# Patient Record
Sex: Female | Born: 1960 | Hispanic: No
Health system: Southern US, Community
[De-identification: ages and names within clinical notes are randomized; demographics above are authoritative.]

## PROBLEM LIST (undated history)

## (undated) DIAGNOSIS — I2510 Atherosclerotic heart disease of native coronary artery without angina pectoris: Secondary | ICD-10-CM

## (undated) DIAGNOSIS — G4733 Obstructive sleep apnea (adult) (pediatric): Secondary | ICD-10-CM

## (undated) DIAGNOSIS — J189 Pneumonia, unspecified organism: Secondary | ICD-10-CM

## (undated) DIAGNOSIS — Z9989 Dependence on other enabling machines and devices: Secondary | ICD-10-CM

## (undated) DIAGNOSIS — I10 Essential (primary) hypertension: Secondary | ICD-10-CM

## (undated) DIAGNOSIS — M1990 Unspecified osteoarthritis, unspecified site: Secondary | ICD-10-CM

## (undated) DIAGNOSIS — E7800 Pure hypercholesterolemia, unspecified: Secondary | ICD-10-CM

## (undated) DIAGNOSIS — N200 Calculus of kidney: Secondary | ICD-10-CM

## (undated) DIAGNOSIS — Z8489 Family history of other specified conditions: Secondary | ICD-10-CM

## (undated) DIAGNOSIS — G4700 Insomnia, unspecified: Secondary | ICD-10-CM

## (undated) DIAGNOSIS — K219 Gastro-esophageal reflux disease without esophagitis: Secondary | ICD-10-CM

## (undated) HISTORY — PX: APPENDECTOMY: SHX54

## (undated) HISTORY — DX: Obstructive sleep apnea (adult) (pediatric): G47.33

## (undated) HISTORY — DX: Insomnia, unspecified: G47.00

## (undated) HISTORY — PX: ABDOMINAL HYSTERECTOMY: SHX81

## (undated) HISTORY — PX: CORONARY ANGIOPLASTY WITH STENT PLACEMENT: SHX49

## (undated) HISTORY — PX: SUPRAVENTRICULAR TACHYCARDIA ABLATION: SHX6106

---

## 1988-10-01 HISTORY — PX: REFRACTIVE SURGERY: SHX103

## 2007-12-10 ENCOUNTER — Encounter: Admission: RE | Admit: 2007-12-10 | Discharge: 2007-12-10 | Payer: Self-pay | Admitting: Internal Medicine

## 2007-12-20 ENCOUNTER — Encounter: Admission: RE | Admit: 2007-12-20 | Discharge: 2007-12-20 | Payer: Self-pay | Admitting: Internal Medicine

## 2008-10-25 ENCOUNTER — Emergency Department (HOSPITAL_BASED_OUTPATIENT_CLINIC_OR_DEPARTMENT_OTHER): Admission: EM | Admit: 2008-10-25 | Discharge: 2008-10-25 | Payer: Self-pay | Admitting: Emergency Medicine

## 2008-10-25 ENCOUNTER — Ambulatory Visit: Payer: Self-pay | Admitting: Radiology

## 2009-01-21 ENCOUNTER — Ambulatory Visit: Payer: Self-pay | Admitting: Diagnostic Radiology

## 2009-01-21 ENCOUNTER — Emergency Department (HOSPITAL_BASED_OUTPATIENT_CLINIC_OR_DEPARTMENT_OTHER): Admission: EM | Admit: 2009-01-21 | Discharge: 2009-01-21 | Payer: Self-pay | Admitting: Emergency Medicine

## 2009-03-31 HISTORY — PX: CARDIAC CATHETERIZATION: SHX172

## 2009-04-22 ENCOUNTER — Ambulatory Visit: Payer: Self-pay | Admitting: Interventional Radiology

## 2009-04-22 ENCOUNTER — Encounter: Payer: Self-pay | Admitting: Emergency Medicine

## 2009-04-22 ENCOUNTER — Inpatient Hospital Stay (HOSPITAL_COMMUNITY): Admission: EM | Admit: 2009-04-22 | Discharge: 2009-04-24 | Payer: Self-pay | Admitting: Cardiovascular Disease

## 2009-06-18 ENCOUNTER — Emergency Department (HOSPITAL_COMMUNITY): Admission: EM | Admit: 2009-06-18 | Discharge: 2009-06-18 | Payer: Self-pay | Admitting: Emergency Medicine

## 2009-07-16 ENCOUNTER — Encounter: Admission: RE | Admit: 2009-07-16 | Discharge: 2009-07-16 | Payer: Self-pay | Admitting: Internal Medicine

## 2010-02-21 ENCOUNTER — Encounter: Payer: Self-pay | Admitting: Internal Medicine

## 2010-02-22 ENCOUNTER — Encounter: Payer: Self-pay | Admitting: Internal Medicine

## 2010-04-26 LAB — DIFFERENTIAL
Basophils Relative: 0 % (ref 0–1)
Eosinophils Absolute: 0.1 10*3/uL (ref 0.0–0.7)
Eosinophils Relative: 2 % (ref 0–5)
Lymphs Abs: 2.8 10*3/uL (ref 0.7–4.0)
Monocytes Absolute: 0.6 10*3/uL (ref 0.1–1.0)
Neutro Abs: 5 10*3/uL (ref 1.7–7.7)
Neutrophils Relative %: 59 % (ref 43–77)

## 2010-04-26 LAB — CBC
HCT: 40.5 % (ref 36.0–46.0)
Hemoglobin: 15.3 g/dL — ABNORMAL HIGH (ref 12.0–15.0)
Hemoglobin: 13.8 g/dL (ref 12.0–15.0)
MCHC: 34.3 g/dL (ref 30.0–36.0)
MCHC: 33.4 g/dL (ref 30.0–36.0)
MCV: 90.5 fL (ref 78.0–100.0)
MCV: 91.9 fL (ref 78.0–100.0)
MCV: 92.2 fL (ref 78.0–100.0)
RBC: 4.39 MIL/uL (ref 3.87–5.11)
RBC: 4.47 MIL/uL (ref 3.87–5.11)
RDW: 12.9 % (ref 11.5–15.5)
WBC: 8.5 10*3/uL (ref 4.0–10.5)
WBC: 8.2 10*3/uL (ref 4.0–10.5)

## 2010-04-26 LAB — HEPATIC FUNCTION PANEL
ALT: 29 U/L (ref 0–35)
AST: 22 U/L (ref 0–37)
Albumin: 3.8 g/dL (ref 3.5–5.2)
Alkaline Phosphatase: 68 U/L (ref 39–117)
Indirect Bilirubin: 0.2 mg/dL — ABNORMAL LOW (ref 0.3–0.9)
Total Bilirubin: 0.4 mg/dL (ref 0.3–1.2)

## 2010-04-26 LAB — LIPID PANEL
LDL Cholesterol: 91 mg/dL (ref 0–99)
Triglycerides: 162 mg/dL — ABNORMAL HIGH (ref <150)
VLDL: 32 mg/dL (ref 0–40)

## 2010-04-26 LAB — CK TOTAL AND CKMB (NOT AT ARMC)
CK, MB: 1 ng/mL (ref 0.3–4.0)
CK, MB: 1.8 ng/mL (ref 0.3–4.0)
Relative Index: INVALID (ref 0.0–2.5)
Total CK: 43 U/L (ref 7–177)
Total CK: 58 U/L (ref 7–177)

## 2010-04-26 LAB — TSH: TSH: 1.422 u[IU]/mL (ref 0.350–4.500)

## 2010-04-26 LAB — COMPREHENSIVE METABOLIC PANEL
Albumin: 3.5 g/dL (ref 3.5–5.2)
Alkaline Phosphatase: 58 U/L (ref 39–117)
BUN: 13 mg/dL (ref 6–23)
CO2: 28 mEq/L (ref 19–32)
Chloride: 106 mEq/L (ref 96–112)
Creatinine, Ser: 0.61 mg/dL (ref 0.4–1.2)
GFR calc non Af Amer: >60 mL/min (ref >60)
Potassium: 4.3 mEq/L (ref 3.5–5.1)
Total Bilirubin: 0.5 mg/dL (ref 0.3–1.2)

## 2010-04-26 LAB — BRAIN NATRIURETIC PEPTIDE: Pro B Natriuretic peptide (BNP): <30 pg/mL (ref 0.0–100.0)

## 2010-04-26 LAB — TROPONIN I
Troponin I: <0.01 ng/mL (ref 0.00–0.06)
Troponin I: <0.01 ng/mL (ref 0.00–0.06)

## 2010-04-26 LAB — BASIC METABOLIC PANEL
BUN: 18 mg/dL (ref 6–23)
CO2: 28 mEq/L (ref 19–32)
CO2: 28 mEq/L (ref 19–32)
Calcium: 8.9 mg/dL (ref 8.4–10.5)
Chloride: 103 mEq/L (ref 96–112)
GFR calc non Af Amer: >60 mL/min (ref >60)
Glucose, Bld: 99 mg/dL (ref 70–99)
Glucose, Bld: 113 mg/dL — ABNORMAL HIGH (ref 70–99)
Potassium: 3.9 mEq/L (ref 3.5–5.1)
Sodium: 145 mEq/L (ref 135–145)

## 2010-04-26 LAB — POCT CARDIAC MARKERS: Myoglobin, poc: 40.9 ng/mL (ref 12–200)

## 2010-04-26 LAB — HEPARIN LEVEL (UNFRACTIONATED): Heparin Unfractionated: <0.1 IU/mL — ABNORMAL LOW (ref 0.30–0.70)

## 2010-04-26 LAB — PROTIME-INR: Prothrombin Time: 12.3 seconds (ref 11.6–15.2)

## 2010-04-26 LAB — HEMOGLOBIN A1C: Hgb A1c MFr Bld: 5.6 % (ref 4.6–6.1)

## 2010-04-28 ENCOUNTER — Emergency Department (HOSPITAL_COMMUNITY)
Admission: EM | Admit: 2010-04-28 | Discharge: 2010-04-28 | Disposition: A | Discharge status: Home / Self Care | Payer: Self-pay | Attending: Emergency Medicine | Admitting: Emergency Medicine

## 2010-04-28 ENCOUNTER — Emergency Department (HOSPITAL_COMMUNITY): Payer: Self-pay

## 2010-04-28 DIAGNOSIS — R079 Chest pain, unspecified: Secondary | ICD-10-CM | POA: Insufficient documentation

## 2010-04-28 DIAGNOSIS — R209 Unspecified disturbances of skin sensation: Secondary | ICD-10-CM | POA: Insufficient documentation

## 2010-04-28 DIAGNOSIS — R5381 Other malaise: Secondary | ICD-10-CM | POA: Insufficient documentation

## 2010-04-28 DIAGNOSIS — F411 Generalized anxiety disorder: Secondary | ICD-10-CM | POA: Insufficient documentation

## 2010-04-28 DIAGNOSIS — R51 Headache: Secondary | ICD-10-CM | POA: Insufficient documentation

## 2010-04-28 DIAGNOSIS — I10 Essential (primary) hypertension: Secondary | ICD-10-CM | POA: Insufficient documentation

## 2010-04-28 DIAGNOSIS — H11419 Vascular abnormalities of conjunctiva, unspecified eye: Secondary | ICD-10-CM | POA: Insufficient documentation

## 2010-04-28 DIAGNOSIS — Z79899 Other long term (current) drug therapy: Secondary | ICD-10-CM | POA: Insufficient documentation

## 2010-04-28 DIAGNOSIS — Z9861 Coronary angioplasty status: Secondary | ICD-10-CM | POA: Insufficient documentation

## 2010-04-28 DIAGNOSIS — E876 Hypokalemia: Secondary | ICD-10-CM | POA: Insufficient documentation

## 2010-04-28 LAB — CBC
HCT: 43.1 % (ref 36.0–46.0)
MCH: 29.7 pg (ref 26.0–34.0)
MCV: 87.6 fL (ref 78.0–100.0)
RBC: 4.92 MIL/uL (ref 3.87–5.11)
WBC: 9.7 10*3/uL (ref 4.0–10.5)

## 2010-04-28 LAB — URINALYSIS, ROUTINE W REFLEX MICROSCOPIC
Bilirubin Urine: NEGATIVE
Glucose, UA: NEGATIVE mg/dL
Hgb urine dipstick: NEGATIVE
Nitrite: NEGATIVE
Specific Gravity, Urine: 1.013 (ref 1.005–1.030)
pH: 7 (ref 5.0–8.0)

## 2010-04-28 LAB — DIFFERENTIAL
Eosinophils Absolute: 0.1 10*3/uL (ref 0.0–0.7)
Eosinophils Relative: 1 % (ref 0–5)
Lymphocytes Relative: 34 % (ref 12–46)
Lymphs Abs: 3.3 10*3/uL (ref 0.7–4.0)
Monocytes Relative: 6 % (ref 3–12)
Neutrophils Relative %: 58 % (ref 43–77)

## 2010-04-28 LAB — COMPREHENSIVE METABOLIC PANEL
ALT: 32 U/L (ref 0–35)
AST: 23 U/L (ref 0–37)
Albumin: 4 g/dL (ref 3.5–5.2)
Alkaline Phosphatase: 66 U/L (ref 39–117)
Chloride: 104 mEq/L (ref 96–112)
Potassium: 3 mEq/L — ABNORMAL LOW (ref 3.5–5.1)
Sodium: 142 mEq/L (ref 135–145)
Total Bilirubin: 0.5 mg/dL (ref 0.3–1.2)
Total Protein: 8.1 g/dL (ref 6.0–8.3)

## 2010-04-28 LAB — POCT CARDIAC MARKERS
CKMB, poc: 2.7 ng/mL (ref 1.0–8.0)
Troponin i, poc: <0.05 ng/mL (ref 0.00–0.09)

## 2010-05-03 LAB — URINALYSIS, ROUTINE W REFLEX MICROSCOPIC
Bilirubin Urine: NEGATIVE
Glucose, UA: NEGATIVE mg/dL
Leukocytes, UA: NEGATIVE
Nitrite: POSITIVE — AB
Specific Gravity, Urine: 1.01 (ref 1.005–1.030)
pH: 6.5 (ref 5.0–8.0)

## 2010-05-03 LAB — BASIC METABOLIC PANEL
CO2: 27 mEq/L (ref 19–32)
Chloride: 104 mEq/L (ref 96–112)
GFR calc non Af Amer: >60 mL/min (ref >60)
Glucose, Bld: 94 mg/dL (ref 70–99)
Potassium: 4 mEq/L (ref 3.5–5.1)
Sodium: 145 mEq/L (ref 135–145)

## 2010-05-03 LAB — URINE MICROSCOPIC-ADD ON

## 2010-05-07 LAB — COMPREHENSIVE METABOLIC PANEL
BUN: 18 mg/dL (ref 6–23)
CO2: 26 mEq/L (ref 19–32)
Chloride: 108 mEq/L (ref 96–112)
Creatinine, Ser: 0.6 mg/dL (ref 0.4–1.2)
GFR calc non Af Amer: >60 mL/min (ref >60)
Glucose, Bld: 79 mg/dL (ref 70–99)
Total Bilirubin: 0.7 mg/dL (ref 0.3–1.2)

## 2010-05-07 LAB — URINALYSIS, ROUTINE W REFLEX MICROSCOPIC
Bilirubin Urine: NEGATIVE
Protein, ur: NEGATIVE mg/dL
Urobilinogen, UA: 0.2 mg/dL (ref 0.0–1.0)

## 2010-05-07 LAB — CBC
HCT: 37.1 % (ref 36.0–46.0)
MCV: 90.9 fL (ref 78.0–100.0)
RBC: 4.08 MIL/uL (ref 3.87–5.11)
WBC: 7.2 10*3/uL (ref 4.0–10.5)

## 2010-05-07 LAB — DIFFERENTIAL
Basophils Absolute: 0.1 10*3/uL (ref 0.0–0.1)
Eosinophils Relative: 2 % (ref 0–5)
Lymphocytes Relative: 27 % (ref 12–46)
Neutro Abs: 4.6 10*3/uL (ref 1.7–7.7)

## 2010-05-07 LAB — LIPASE, BLOOD: Lipase: 78 U/L (ref 23–300)

## 2010-12-02 ENCOUNTER — Inpatient Hospital Stay (INDEPENDENT_AMBULATORY_CARE_PROVIDER_SITE_OTHER)
Admission: RE | Admit: 2010-12-02 | Discharge: 2010-12-02 | Disposition: A | Discharge status: Home / Self Care | Payer: Self-pay | Source: Ambulatory Visit | Attending: Emergency Medicine | Admitting: Emergency Medicine

## 2010-12-02 DIAGNOSIS — I10 Essential (primary) hypertension: Secondary | ICD-10-CM

## 2010-12-02 DIAGNOSIS — R252 Cramp and spasm: Secondary | ICD-10-CM

## 2010-12-02 LAB — COMPREHENSIVE METABOLIC PANEL
ALT: 24 U/L (ref 0–35)
AST: 18 U/L (ref 0–37)
Albumin: 4.5 g/dL (ref 3.5–5.2)
Alkaline Phosphatase: 72 U/L (ref 39–117)
BUN: 13 mg/dL (ref 6–23)
Chloride: 101 mEq/L (ref 96–112)
Potassium: 3.7 mEq/L (ref 3.5–5.1)
Sodium: 139 mEq/L (ref 135–145)
Total Bilirubin: 0.3 mg/dL (ref 0.3–1.2)

## 2010-12-02 LAB — CBC
HCT: 44.4 % (ref 36.0–46.0)
RDW: 13.2 % (ref 11.5–15.5)
WBC: 7 10*3/uL (ref 4.0–10.5)

## 2010-12-02 LAB — POCT I-STAT, CHEM 8
Calcium, Ion: 1.13 mmol/L (ref 1.12–1.32)
Chloride: 105 mEq/L (ref 96–112)
HCT: 46 % (ref 36.0–46.0)
Potassium: 4 mEq/L (ref 3.5–5.1)

## 2010-12-02 LAB — TSH: TSH: 1.302 u[IU]/mL (ref 0.350–4.500)

## 2010-12-31 ENCOUNTER — Emergency Department (INDEPENDENT_AMBULATORY_CARE_PROVIDER_SITE_OTHER): Payer: Worker's Compensation

## 2010-12-31 ENCOUNTER — Emergency Department (HOSPITAL_BASED_OUTPATIENT_CLINIC_OR_DEPARTMENT_OTHER): Payer: Worker's Compensation

## 2010-12-31 ENCOUNTER — Emergency Department (HOSPITAL_BASED_OUTPATIENT_CLINIC_OR_DEPARTMENT_OTHER)
Admission: EM | Admit: 2010-12-31 | Discharge: 2010-12-31 | Disposition: A | Discharge status: Home / Self Care | Payer: Worker's Compensation | Attending: Emergency Medicine | Admitting: Emergency Medicine

## 2010-12-31 ENCOUNTER — Encounter: Payer: Self-pay | Admitting: *Deleted

## 2010-12-31 DIAGNOSIS — M5030 Other cervical disc degeneration, unspecified cervical region: Secondary | ICD-10-CM

## 2010-12-31 DIAGNOSIS — R079 Chest pain, unspecified: Secondary | ICD-10-CM | POA: Insufficient documentation

## 2010-12-31 DIAGNOSIS — S4980XA Other specified injuries of shoulder and upper arm, unspecified arm, initial encounter: Secondary | ICD-10-CM | POA: Insufficient documentation

## 2010-12-31 DIAGNOSIS — W19XXXA Unspecified fall, initial encounter: Secondary | ICD-10-CM | POA: Insufficient documentation

## 2010-12-31 DIAGNOSIS — S139XXA Sprain of joints and ligaments of unspecified parts of neck, initial encounter: Secondary | ICD-10-CM

## 2010-12-31 DIAGNOSIS — M25519 Pain in unspecified shoulder: Secondary | ICD-10-CM

## 2010-12-31 DIAGNOSIS — S46909A Unspecified injury of unspecified muscle, fascia and tendon at shoulder and upper arm level, unspecified arm, initial encounter: Secondary | ICD-10-CM | POA: Insufficient documentation

## 2010-12-31 DIAGNOSIS — M25559 Pain in unspecified hip: Secondary | ICD-10-CM

## 2010-12-31 DIAGNOSIS — S7000XA Contusion of unspecified hip, initial encounter: Secondary | ICD-10-CM | POA: Insufficient documentation

## 2010-12-31 DIAGNOSIS — Y9229 Other specified public building as the place of occurrence of the external cause: Secondary | ICD-10-CM | POA: Insufficient documentation

## 2010-12-31 DIAGNOSIS — S7001XA Contusion of right hip, initial encounter: Secondary | ICD-10-CM

## 2010-12-31 DIAGNOSIS — M542 Cervicalgia: Secondary | ICD-10-CM

## 2010-12-31 DIAGNOSIS — I10 Essential (primary) hypertension: Secondary | ICD-10-CM | POA: Insufficient documentation

## 2010-12-31 DIAGNOSIS — S4990XA Unspecified injury of shoulder and upper arm, unspecified arm, initial encounter: Secondary | ICD-10-CM

## 2010-12-31 HISTORY — DX: Essential (primary) hypertension: I10

## 2010-12-31 MED ORDER — IBUPROFEN 800 MG PO TABS: 800.0000 mg | ORAL_TABLET | Freq: Three times a day (TID) | ORAL | Status: AC

## 2010-12-31 MED ORDER — HYDROCODONE-ACETAMINOPHEN 5-325 MG PO TABS
1.0000 | ORAL_TABLET | Freq: Once | ORAL | Status: AC
  Administered 2010-12-31: 1 via ORAL
  Filled 2010-12-31: qty 1

## 2010-12-31 MED ORDER — HYDROCODONE-ACETAMINOPHEN 5-325 MG PO TABS: 2.0000 | ORAL_TABLET | ORAL | Status: AC | PRN

## 2010-12-31 NOTE — ED Notes (Signed)
Pt up to BR after cleared by EDPA Keenan Bachelor after xray results reviewed

## 2010-12-31 NOTE — ED Notes (Signed)
Pt c/o pain with deep breaths-EDP Sofia at [facility] and aware-orders for CXR

## 2010-12-31 NOTE — ED Notes (Signed)
Pt assisted with bed pan

## 2010-12-31 NOTE — ED Notes (Signed)
EMS states patient was at [facility] and slipped on a wet floor.  Patient speaks no english.  Patient fell and caught herself with her right arm.  C/O in right shoulder and arm, and generalized pain thru out her body.  Patient is fully immobilized on LSB.

## 2010-12-31 NOTE — ED Provider Notes (Signed)
History     CSN: [id number] Arrival date & time: 12/31/2010 12:21 PM   First MD Initiated Contact with Patient 12/31/10 1251      Chief Complaint  Patient presents with  . Fall    (Consider location/radiation/quality/duration/timing/severity/associated sxs/prior treatment) Patient is a 50 y.o. female presenting with fall. The history is provided by the patient. A language interpreter was used.  Fall The accident occurred less than 1 hour ago. The fall occurred while standing. She fell from a height of 3 to 5 ft. She landed on concrete. There was no blood loss. The point of impact was the right shoulder, right hip and left shoulder. The pain is present in the right hip. The pain is at a severity of 5/10. The pain is moderate. She was not ambulatory at the scene. There was no entrapment after the fall. There was no drug use involved in the accident. There was no alcohol use involved in the accident. Pertinent negatives include no visual change. The symptoms are aggravated by use of the injured limb. Treatment on scene includes a c-collar and a backboard. She has tried immobilization for the symptoms. The treatment provided no relief.  Pt reports she slipped and fell at [facility].   Past Medical History  Diagnosis Date  . Arrhythmia   . Hypertension     Past Surgical History  Procedure Date  . Cardiac electrophysiology mapping and ablation     No family history on file.  History  Substance Use Topics  . Smoking status: Not on file  . Smokeless tobacco: Not on file  . Alcohol Use:     OB History    Grav Para Term Preterm Abortions TAB SAB Ect Mult Living                  Review of Systems  Unable to perform ROS Musculoskeletal: Positive for back pain and joint swelling.  All other systems reviewed and are negative.    Allergies  Review of patient's allergies indicates no known allergies.  Home Medications   Current Outpatient Rx  Name Route Sig Dispense Refill  .  UNKNOWN TO PATIENT  BP med-unsure of name       BP 135/79  Pulse 73  Temp(Src) 98.1 F (36.7 C) (Oral)  Resp 20  SpO2 100%  Physical Exam  Nursing note and vitals reviewed. Constitutional: She is oriented to person, place, and time. She appears well-developed and well-nourished.  HENT:  Head: Normocephalic.  Right Ear: External ear normal.  Eyes: Pupils are equal, round, and reactive to light.  Neck: Normal range of motion.  Cardiovascular: Normal rate and normal heart sounds.   Pulmonary/Chest: Effort normal.  Abdominal: Soft. Bowel sounds are normal.  Musculoskeletal: She exhibits edema and tenderness.       Tender c spine,  Tender bilat shoulders,  Tender right hip,  Decreased range of motion,  Ns and nv intact  Neurological: She is alert and oriented to person, place, and time. She has normal reflexes.  Skin: Skin is warm and dry.  Psychiatric: She has a normal mood and affect.    ED Course  Procedures (including critical care time)  Labs Reviewed - No data to display Dg Cervical Spine Complete  12/31/2010  *RADIOLOGY REPORT*  Clinical Data: Pain in bilateral shoulders and neck, fall  CERVICAL SPINE - COMPLETE 4+ VIEW  Comparison: Supine exam in-collar compared to 06/19/2007  Findings: Osseous demineralization. Slight disc space narrowing with endplate spur formation C4-C5.  Prevertebral soft tissues normal thickness. Scattered facet degenerative changes. Foramina suboptimally profiled. C1-C2 alignment normal, though odontoid superimposed with the skull base. Vertebral body and disc space heights otherwise maintained. No acute fracture, dislocation, or bone destruction.  IMPRESSION: Osseous demineralization. Degenerative disc and facet disease changes of the cervical spine as above. No acute cervical spine abnormalities identified on supine in- collar cervical spine series.  Original Report Authenticated By: Lollie Marrow, M.D.   Dg Shoulder Right  12/31/2010  *RADIOLOGY  REPORT*  Clinical Data: Bilateral shoulder pain post fall  RIGHT SHOULDER - 2+ VIEW  Comparison: None  Findings: Osseous demineralization. AC joint alignment normal. No acute fracture, dislocation or bone destruction. Visualized right ribs intact.  IMPRESSION: Osseous demineralization. No acute abnormalities.  Original Report Authenticated By: Lollie Marrow, M.D.   Dg Hip Complete Right  12/31/2010  *RADIOLOGY REPORT*  Clinical Data: Fall, right hip pain.  RIGHT HIP - COMPLETE 2+ VIEW  Comparison: None.  Findings: No acute bony abnormality.  Specifically, no fracture, subluxation, or dislocation.  Soft tissues are intact.  SI joints and hip joints are symmetric and unremarkable.  IMPRESSION: No acute bony abnormality.  Original Report Authenticated By: Cyndie Chime, M.D.   Dg Shoulder Left  12/31/2010  *RADIOLOGY REPORT*  Clinical Data: Fall, shoulder pain.  LEFT SHOULDER - 2+ VIEW  Comparison: None  Findings: Early degenerative changes in the left AC joint. No acute bony abnormality.  Specifically, no fracture, subluxation, or dislocation.  Soft tissues are intact.  IMPRESSION: No acute bony abnormality.  Original Report Authenticated By: Cyndie Chime, M.D.     No diagnosis found.    MDM  Pt advised to follow up with Dr. Pearletha Forge for recheck next week if pain persist.    Post discarge,  Pt complains of pain in chest and ribs.  Pt worried she has injured something in her chest.  (Pt has no bruising at any site)  I will obtain chest xray.    Langston Masker, [place] 12/31/10 1513  Langston Masker, [place] 12/31/10 1531  Langston Masker, [place] 12/31/10 [phone]

## 2011-01-04 NOTE — ED Provider Notes (Signed)
History/physical exam/procedure(s) were performed by non-physician practitioner and as supervising physician I was immediately available for consultation/collaboration. I have reviewed all notes and am in agreement with care and plan.  Mizraim Harmening S Cayle Thunder, MD 01/04/11 0913 

## 2011-12-25 ENCOUNTER — Encounter (HOSPITAL_BASED_OUTPATIENT_CLINIC_OR_DEPARTMENT_OTHER): Payer: Self-pay | Admitting: Emergency Medicine

## 2011-12-25 ENCOUNTER — Emergency Department (HOSPITAL_BASED_OUTPATIENT_CLINIC_OR_DEPARTMENT_OTHER): Payer: Self-pay

## 2011-12-25 ENCOUNTER — Emergency Department (HOSPITAL_BASED_OUTPATIENT_CLINIC_OR_DEPARTMENT_OTHER)
Admission: EM | Admit: 2011-12-25 | Discharge: 2011-12-25 | Disposition: A | Discharge status: Home / Self Care | Payer: Self-pay | Attending: Emergency Medicine | Admitting: Emergency Medicine

## 2011-12-25 DIAGNOSIS — R143 Flatulence: Secondary | ICD-10-CM | POA: Insufficient documentation

## 2011-12-25 DIAGNOSIS — R142 Eructation: Secondary | ICD-10-CM | POA: Insufficient documentation

## 2011-12-25 DIAGNOSIS — R141 Gas pain: Secondary | ICD-10-CM | POA: Insufficient documentation

## 2011-12-25 DIAGNOSIS — M25561 Pain in right knee: Secondary | ICD-10-CM

## 2011-12-25 DIAGNOSIS — R42 Dizziness and giddiness: Secondary | ICD-10-CM | POA: Insufficient documentation

## 2011-12-25 DIAGNOSIS — M549 Dorsalgia, unspecified: Secondary | ICD-10-CM | POA: Insufficient documentation

## 2011-12-25 DIAGNOSIS — I10 Essential (primary) hypertension: Secondary | ICD-10-CM | POA: Insufficient documentation

## 2011-12-25 DIAGNOSIS — M25569 Pain in unspecified knee: Secondary | ICD-10-CM | POA: Insufficient documentation

## 2011-12-25 DIAGNOSIS — Z8679 Personal history of other diseases of the circulatory system: Secondary | ICD-10-CM | POA: Insufficient documentation

## 2011-12-25 MED ORDER — HYDROCODONE-ACETAMINOPHEN 5-325 MG PO TABS: 1.0000 | ORAL_TABLET | Freq: Four times a day (QID) | ORAL | Status: DC | PRN

## 2011-12-25 MED ORDER — ONDANSETRON 4 MG PO TBDP
4.0000 mg | ORAL_TABLET | Freq: Once | ORAL | Status: AC
  Administered 2011-12-25: 4 mg via ORAL
  Filled 2011-12-25: qty 1

## 2011-12-25 MED ORDER — HYDROMORPHONE HCL PF 2 MG/ML IJ SOLN
2.0000 mg | Freq: Once | INTRAMUSCULAR | Status: AC
  Administered 2011-12-25: 2 mg via INTRAMUSCULAR
  Filled 2011-12-25: qty 1

## 2011-12-25 MED ORDER — CYCLOBENZAPRINE HCL 10 MG PO TABS: 10.0000 mg | ORAL_TABLET | Freq: Two times a day (BID) | ORAL | Status: DC | PRN

## 2011-12-25 NOTE — ED Notes (Signed)
Husband sts that pt had acute onset of back pain bilateral lower back. Husband sts that she recently had knee pain 3 weeks ago; did not see MD for this. Pt denies injury or fall. Denies history of this pain  in past. Husband reports pt has to walk bended over.

## 2011-12-25 NOTE — ED Provider Notes (Signed)
History  This chart was scribed for Shelda Jakes, MD by Donne Anon, ED Scribe. This patient was seen in room MH07/MH07 and the patient's care was started at 5:09 PM.   CSN: [id number]  Arrival date & time 12/25/11  1619   First MD Initiated Contact with Patient 12/25/11 1709      Chief Complaint  Patient presents with  . Back Pain    Patient is a 51 y.o. female presenting with back pain. The history is provided by the patient. No language interpreter was used (Husband translated for patient).  Back Pain  The current episode started 12 to 24 hours ago. The problem occurs constantly. The problem has been gradually worsening. The pain is associated with no known injury. The pain is present in the lumbar spine. The pain does not radiate. The pain is moderate. Pertinent negatives include no chest pain, no fever, no numbness and no abdominal pain. She has tried nothing for the symptoms.    Kiara Sanchez is a 51 y.o. female who presents to the Emergency Department complaining of lower back pain which started this morning. Pt denies any accident for the onset. Pt reports that 3 weeks ago her R knee began bothering her and causes her to walk bent over. Pt denies any injury to the knee, and that it still bothers her. Pt reports previous back pain but nothing this severe. Pt reports tingles to feet, abdominal distention and pressure, dizziness and subjective trouble breathing. Pt denies nausea, vomiting, fever, numbness and abdominal pain. Patient reports using alcohol in social situations.   Past Medical History  Diagnosis Date  . Arrhythmia   . Hypertension     Past Surgical History  Procedure Date  . Cardiac electrophysiology mapping and ablation   . Angioplasty 6 years ago     History reviewed. No pertinent family history.  History  Substance Use Topics  . Smoking status: Never Smoker   . Smokeless tobacco: Not on file  . Alcohol Use: 1.8 oz/week    1 Glasses of wine, 2 Cans  of beer per week     Comment: only drinks socially   Review of Systems  Constitutional: Negative for fever and chills.  HENT: Negative for sore throat and neck stiffness.   Respiratory: Negative for cough and shortness of breath.   Cardiovascular: Negative for chest pain.  Gastrointestinal: Positive for abdominal distention. Negative for nausea, vomiting, abdominal pain and diarrhea.  Musculoskeletal: Positive for back pain.  Neurological: Positive for dizziness. Negative for numbness.  All other systems reviewed and are negative.    Allergies  Review of patient's allergies indicates no known allergies.  Home Medications   Current Outpatient Rx  Name  Route  Sig  Dispense  Refill  . B COMPLEX-C PO TABS   Oral   Take 1 tablet by mouth daily.         Marland Kitchen GLUCOSAMINE-CHONDROITIN 500-400 MG PO TABS   Oral   Take 1 tablet by mouth once.         . TRIAMTERENE-HCTZ 37.5-25 MG PO TABS   Oral   Take 1 tablet by mouth daily.         . CYCLOBENZAPRINE HCL 10 MG PO TABS   Oral   Take 1 tablet (10 mg total) by mouth 2 (two) times daily as needed for muscle spasms.   20 tablet   0   . HYDROCODONE-ACETAMINOPHEN 5-325 MG PO TABS   Oral   Take 1-2 tablets by  mouth every 6 (six) hours as needed for pain.   20 tablet   0     Triage Vitals: BP 128/91  Pulse 66  Temp 97.7 F (36.5 C) (Oral)  Resp 24  Ht 5\' 6"  (1.676 m)  Wt 160 lb (72.576 kg)  BMI 25.82 kg/m2  SpO2 100%  LMP 12/24/2001  Physical Exam  Nursing note and vitals reviewed. Constitutional: She is oriented to person, place, and time. She appears well-developed and well-nourished.  HENT:  Head: Normocephalic and atraumatic.  Eyes: Conjunctivae normal and EOM are normal.  Neck: Normal range of motion. Neck supple. No tracheal deviation present.  Cardiovascular: Normal rate, regular rhythm and normal heart sounds.   No murmur heard. Pulmonary/Chest: Effort normal and breath sounds normal. No respiratory  distress.  Abdominal: Soft. Bowel sounds are normal. There is no tenderness.  Musculoskeletal: Normal range of motion. She exhibits tenderness. She exhibits no edema.       No redness to R knee, no effusion, no edema. Both knees look the same.  Left Paraspinous tenderness. No tenderness when palpated on right.  Lymphadenopathy:    She has no cervical adenopathy.  Neurological: She is alert and oriented to person, place, and time. No cranial nerve deficit.       Pt able to move both sets of fingers and toes    Skin: Skin is warm and dry.  Psychiatric: She has a normal mood and affect. Her behavior is normal.    ED Course  Procedures (including critical care time)  DIAGNOSTIC STUDIES: Oxygen Saturation is 100% on room air, normal by my interpretation.    COORDINATION OF CARE:   5:25 PM: Discussed treatment plan which includes ordering x-rays with pt at bedside and pt agreed to plan.   Labs Reviewed - No data to display Dg Lumbar Spine Complete  12/25/2011  *RADIOLOGY REPORT*  Clinical Data: Low back pain.  LUMBAR SPINE - COMPLETE 4+ VIEW  Comparison: 06/18/2009.  Findings: Five views of the lumbar spine demonstrate no definite acute displaced fracture or compression type fracture.  No defects of the pars interarticularis are appreciated.  Mild multilevel degenerative disc disease and facet arthropathy is noted, most severe at L5-S1.  IMPRESSION: 1.  No acute radiographic abnormality of the lumbar spine to account for the patient's symptoms. 2.  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   Original Report Authenticated By: Trudie Reed, M.D.    Dg Knee Complete 4 Views Right  12/25/2011  *RADIOLOGY REPORT*  Clinical Data: Knee pain.  RIGHT KNEE - COMPLETE 4+ VIEW  Comparison: No priors.  Findings: Four views of the right knee demonstrate no acute displaced fracture, subluxation, dislocation, joint or soft tissue abnormality.  IMPRESSION: 1.  No acute  radiographic abnormality of the right knee.   Original Report Authenticated By: Trudie Reed, M.D.      1. Back pain   2. Knee pain, right       MDM   x-rays of the back and right knee withou obviously no fractures. No neuro deficits. Patient without history of injury. Resource guide provided to help patient find a primary care provider for followup.      I personally performed the services described in this documentation, which was scribed in my presence. The recorded information has been reviewed and is accurate.     Shelda Jakes, MD 12/25/11 [phone]

## 2012-04-24 IMAGING — CR DG CERVICAL SPINE COMPLETE 4+V
8 series · 8 of 8 positions shown · non-contrast
Comparison: Supine exam in-collar compared to 06/19/2007

CLINICAL DATA: Pain in bilateral shoulders and neck, fall

CERVICAL SPINE - COMPLETE 4+ VIEW

[w c-spine lat (1 of 2)]
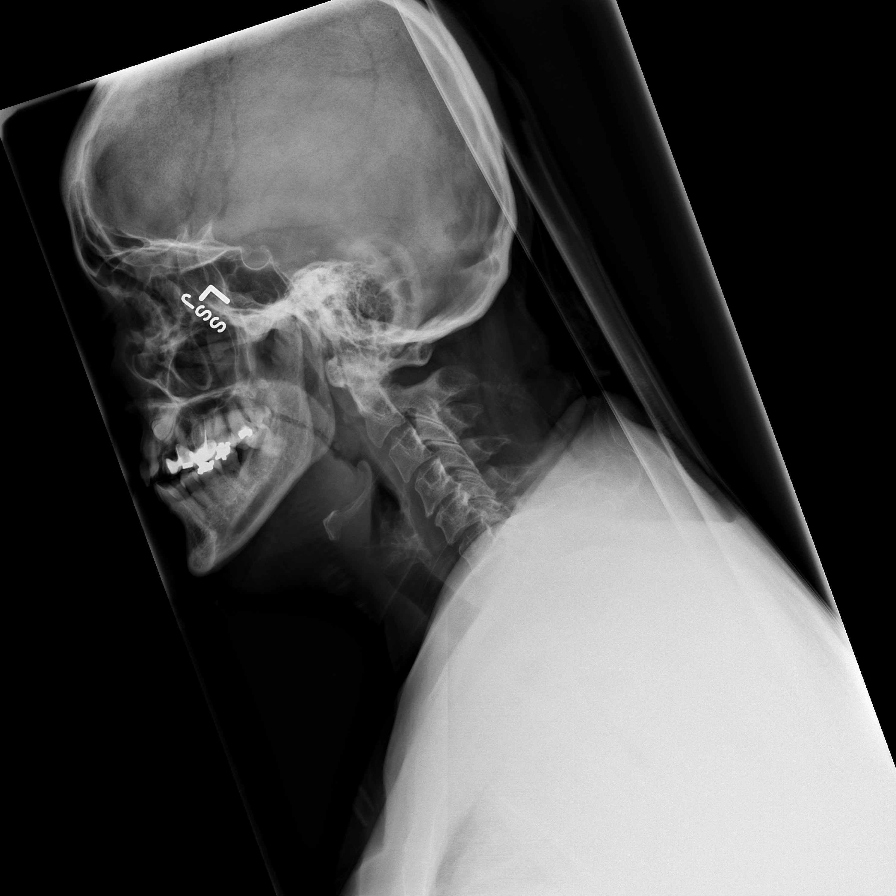

[w c-spine lat (2 of 2)]
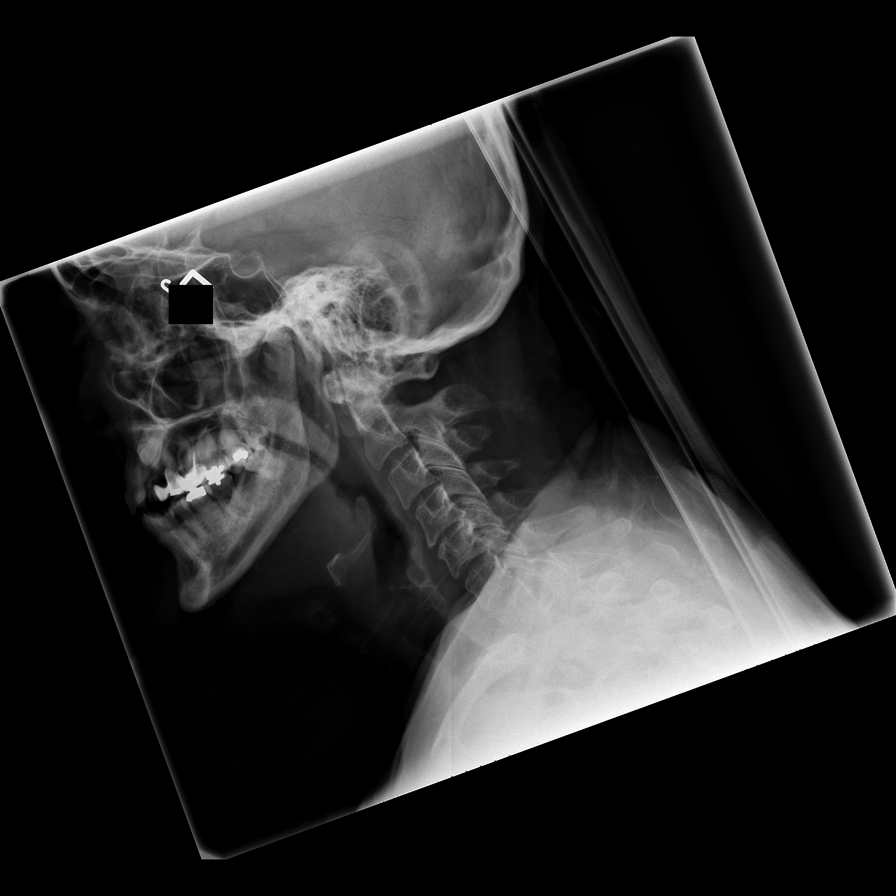

[w swimmers view]
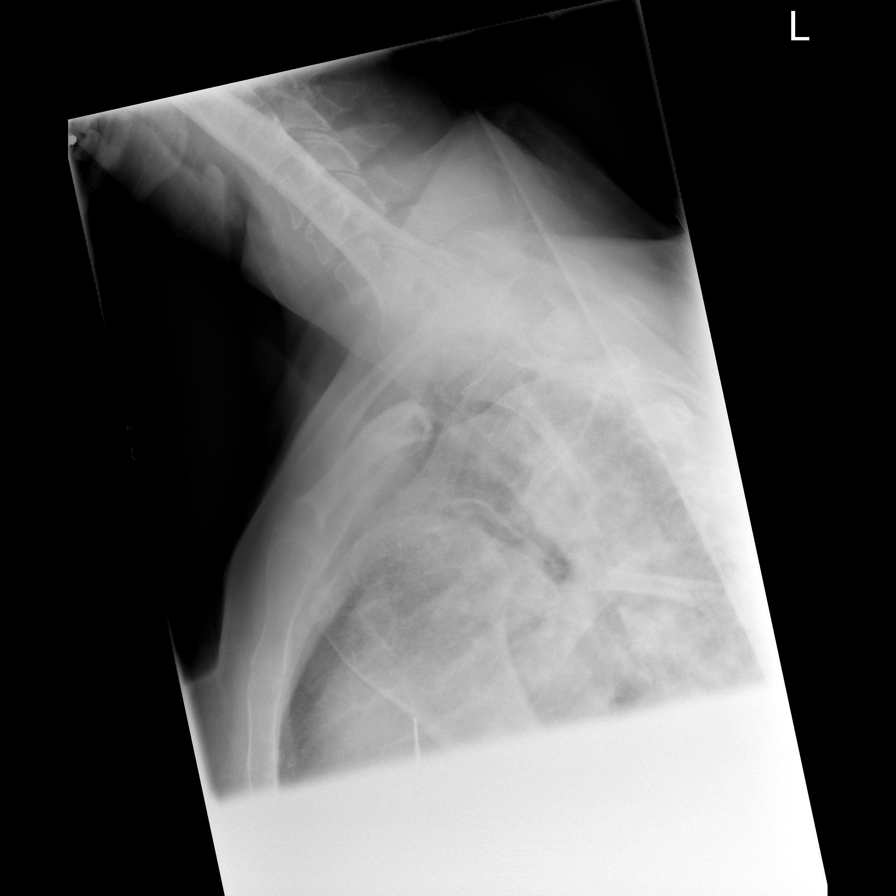

[t c-spine a.p.]
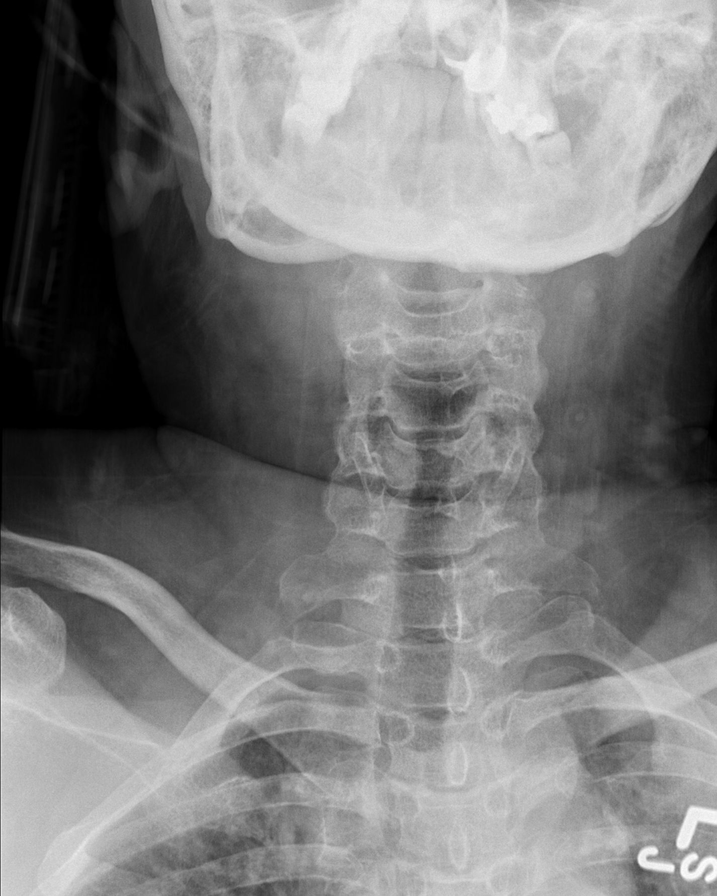

[t c-spine odontoid]
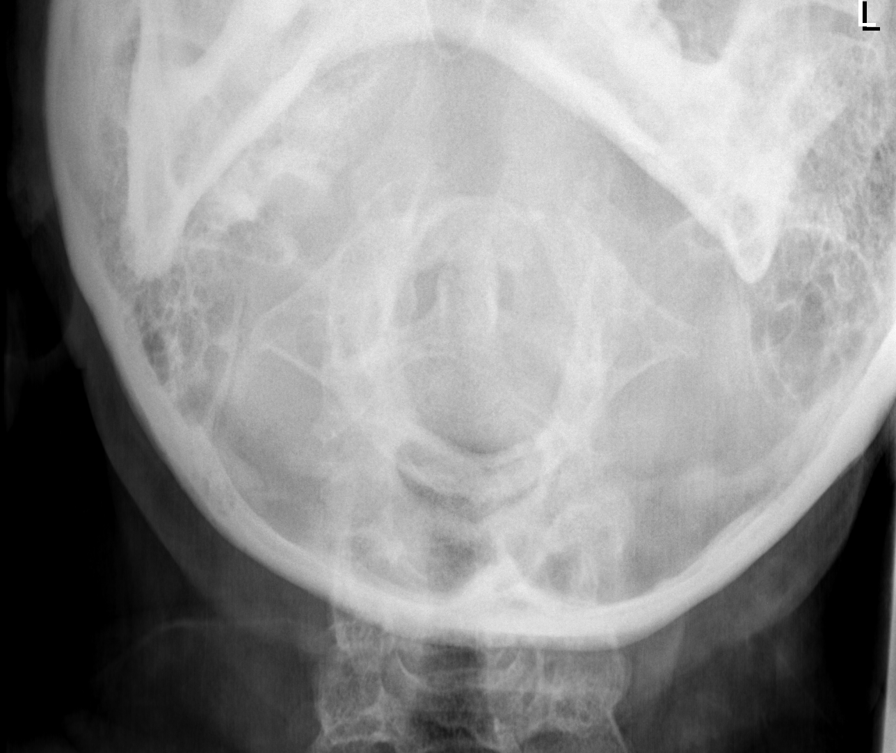

[t c-spine oblique (1 of 2)]
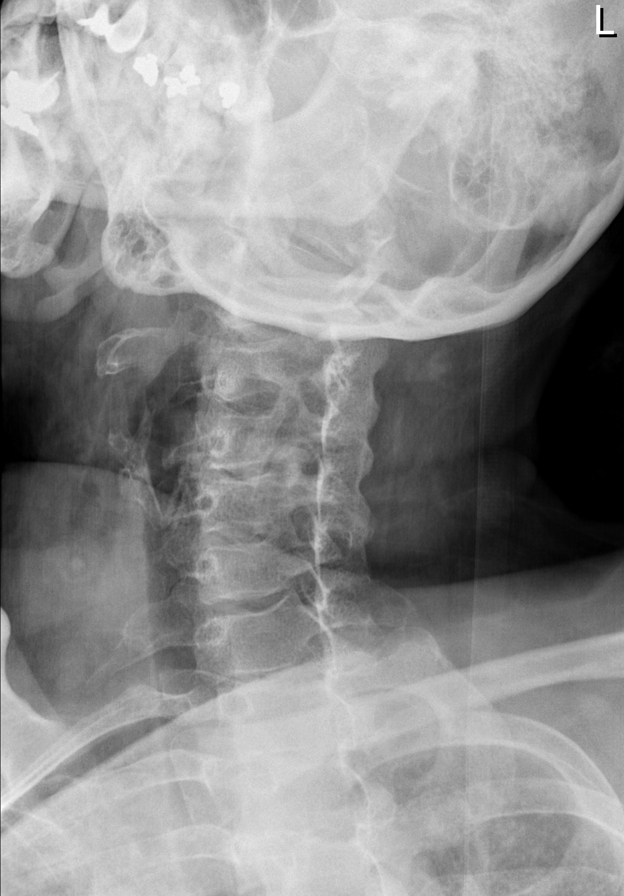

[t c-spine oblique (2 of 2)]
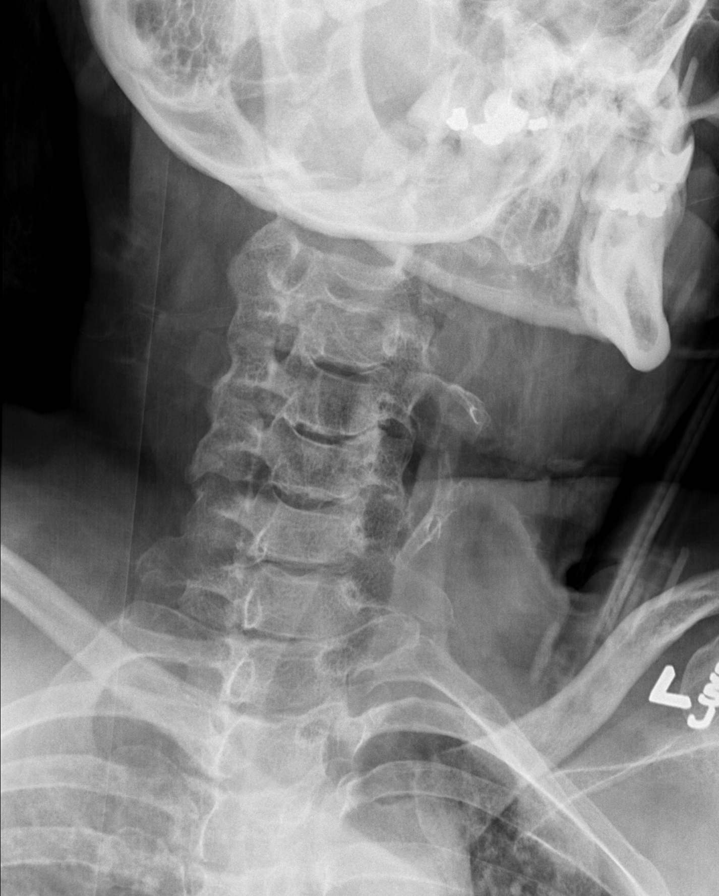

[w c-spine odontoid]
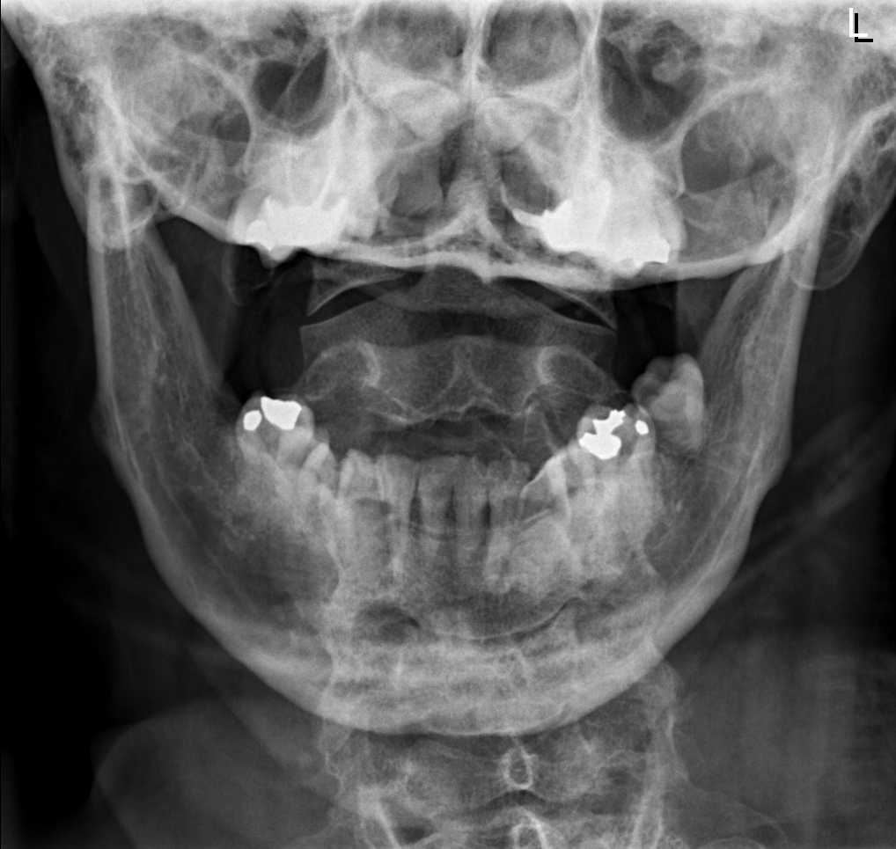

[8 of 8 positions shown; findings below may reference images not displayed]

FINDINGS: Osseous demineralization.
Slight disc space narrowing with endplate spur formation C4-C5.
Prevertebral soft tissues normal thickness.
Scattered facet degenerative changes.
Foramina suboptimally profiled.
C1-C2 alignment normal, though odontoid superimposed with the skull
base.
Vertebral body and disc space heights otherwise maintained.
No acute fracture, dislocation, or bone destruction.
IMPRESSION: Osseous demineralization.
Degenerative disc and facet disease changes of the cervical spine
as above.
No acute cervical spine abnormalities identified on supine in-
collar cervical spine series.

## 2013-01-13 ENCOUNTER — Encounter (HOSPITAL_COMMUNITY): Payer: Self-pay | Admitting: Emergency Medicine

## 2013-01-13 ENCOUNTER — Emergency Department (HOSPITAL_COMMUNITY): Payer: Self-pay

## 2013-01-13 ENCOUNTER — Emergency Department (HOSPITAL_COMMUNITY)
Admission: EM | Admit: 2013-01-13 | Discharge: 2013-01-14 | Disposition: A | Discharge status: Home / Self Care | Payer: Self-pay | Attending: Emergency Medicine | Admitting: Emergency Medicine

## 2013-01-13 DIAGNOSIS — M6281 Muscle weakness (generalized): Secondary | ICD-10-CM | POA: Insufficient documentation

## 2013-01-13 DIAGNOSIS — R42 Dizziness and giddiness: Secondary | ICD-10-CM | POA: Insufficient documentation

## 2013-01-13 DIAGNOSIS — Z79899 Other long term (current) drug therapy: Secondary | ICD-10-CM | POA: Insufficient documentation

## 2013-01-13 DIAGNOSIS — M25559 Pain in unspecified hip: Secondary | ICD-10-CM | POA: Insufficient documentation

## 2013-01-13 DIAGNOSIS — I10 Essential (primary) hypertension: Secondary | ICD-10-CM | POA: Insufficient documentation

## 2013-01-13 DIAGNOSIS — Z9889 Other specified postprocedural states: Secondary | ICD-10-CM | POA: Insufficient documentation

## 2013-01-13 LAB — COMPREHENSIVE METABOLIC PANEL
ALT: 29 U/L (ref 0–35)
Alkaline Phosphatase: 67 U/L (ref 39–117)
CO2: 26 mEq/L (ref 19–32)
Chloride: 105 mEq/L (ref 96–112)
GFR calc Af Amer: >90 mL/min (ref >90)
GFR calc non Af Amer: >90 mL/min (ref >90)
Glucose, Bld: 88 mg/dL (ref 70–99)
Potassium: 3.6 mEq/L (ref 3.5–5.1)
Sodium: 138 mEq/L (ref 135–145)
Total Bilirubin: 0.2 mg/dL — ABNORMAL LOW (ref 0.3–1.2)
Total Protein: 7.7 g/dL (ref 6.0–8.3)

## 2013-01-13 LAB — RAPID URINE DRUG SCREEN, HOSP PERFORMED
Amphetamines: NOT DETECTED
Benzodiazepines: NOT DETECTED
Cocaine: NOT DETECTED
Opiates: NOT DETECTED

## 2013-01-13 LAB — URINALYSIS, ROUTINE W REFLEX MICROSCOPIC
Bilirubin Urine: NEGATIVE
Glucose, UA: NEGATIVE mg/dL
Leukocytes, UA: NEGATIVE
Nitrite: NEGATIVE
Specific Gravity, Urine: 1.021 (ref 1.005–1.030)
pH: 6 (ref 5.0–8.0)

## 2013-01-13 LAB — CBC WITH DIFFERENTIAL/PLATELET
Hemoglobin: 14.3 g/dL (ref 12.0–15.0)
Lymphocytes Relative: 37 % (ref 12–46)
Lymphs Abs: 1.8 10*3/uL (ref 0.7–4.0)
Monocytes Relative: 10 % (ref 3–12)
Neutro Abs: 2.4 10*3/uL (ref 1.7–7.7)
Neutrophils Relative %: 50 % (ref 43–77)
Platelets: 305 10*3/uL (ref 150–400)
RBC: 4.73 MIL/uL (ref 3.87–5.11)
WBC: 4.9 10*3/uL (ref 4.0–10.5)

## 2013-01-13 LAB — URINE MICROSCOPIC-ADD ON

## 2013-01-13 LAB — TROPONIN I: Troponin I: <0.3 ng/mL (ref <0.30)

## 2013-01-13 MED ORDER — LORAZEPAM 2 MG/ML IJ SOLN
1.0000 mg | Freq: Once | INTRAMUSCULAR | Status: AC
  Administered 2013-01-13: 1 mg via INTRAVENOUS
  Filled 2013-01-13: qty 1

## 2013-01-13 MED ORDER — KETOROLAC TROMETHAMINE 30 MG/ML IJ SOLN
30.0000 mg | Freq: Once | INTRAMUSCULAR | Status: AC
  Administered 2013-01-13: 30 mg via INTRAVENOUS
  Filled 2013-01-13: qty 1

## 2013-01-13 MED ORDER — PROCHLORPERAZINE EDISYLATE 5 MG/ML IJ SOLN: 10.0000 mg | Freq: Four times a day (QID) | INTRAMUSCULAR | Status: DC | PRN

## 2013-01-13 MED ORDER — GADOBENATE DIMEGLUMINE 529 MG/ML IV SOLN
15.0000 mL | Freq: Once | INTRAVENOUS | Status: AC | PRN
  Administered 2013-01-13: 15 mL via INTRAVENOUS

## 2013-01-13 MED ORDER — ASPIRIN 325 MG PO TABS
325.0000 mg | ORAL_TABLET | Freq: Every day | ORAL | Status: DC
  Administered 2013-01-13: 325 mg via ORAL
  Filled 2013-01-13: qty 1

## 2013-01-13 NOTE — ED Provider Notes (Signed)
Medical screening examination/treatment/procedure(s) were performed by non-physician practitioner and as supervising physician I was immediately available for consultation/collaboration.  EKG Interpretation    Date/Time:  Sunday January 13 2013 16:00:26 EST Ventricular Rate:  71 PR Interval:  166 QRS Duration: 92 QT Interval:  442 QTC Calculation: 480 R Axis:   62 Text Interpretation:  Sinus rhythm Confirmed by Wilkie Aye  MD, Toni Amend ([id number]) on 01/13/2013 5:22:16 PM              Shon Baton, MD 01/13/13 2219

## 2013-01-13 NOTE — ED Provider Notes (Signed)
[name] S 8:00PM Pt discussed in sign out.  Pt evaluated by neurology, Dr. Amada Jupiter.  Recommends MRIs.  They are pending.  If normal, pt may be d/c home to follow up with neurology outpatient.    12:00AM MRI final resulted.  No concerning findings.  Pt sleeping and comfortable.  Findings discussed with family.  Pt ready to be d/c home.  Follow up given.   Angus Seller, PA-C 01/14/13 [phone]

## 2013-01-13 NOTE — ED Notes (Signed)
Patient transported to MRI 

## 2013-01-13 NOTE — ED Notes (Signed)
Dizzy x 1 month was on and off now worse and weakness and pain on rt side for a month also spots in her vision

## 2013-01-13 NOTE — Consult Note (Addendum)
Neurology Consultation Reason for Consult: Right-sided weakness Referring Physician: Horton, C  CC: Right-sided weakness  History is obtained from: Patient, family  HPI: Kiara Sanchez is a 52 y.o. female with a history of hypertension who presents with several months of intermittent dizziness, retro-orbital pain, spots in her vision. She states that this has happened repeatedly, worse over the past few days. She states that when she is still she does okay, but with any movement she becomes quite dizzy. She has had some trouble walking. She also complains of some right-sided pain which feels like a cramp particularly below the hip.   LKW: Several months ago tpa given?: no, outside of window    ROS: A 14 point ROS was performed and is negative except as noted in the HPI.  Past Medical History  Diagnosis Date  . Arrhythmia   . Hypertension     Family History: Father-stroke  Social History: Tob: Denies  Exam: Current vital signs: BP 119/68  Pulse 72  Temp(Src) 97.8 F (36.6 C) (Oral)  Resp 16  SpO2 95%  LMP 12/24/2001 Vital signs in last 24 hours: Temp:  [97.8 F (36.6 C)-97.9 F (36.6 C)] 97.8 F (36.6 C) (12/14 1820) Pulse Rate:  [72-78] 72 (12/14 1820) Resp:  [16-20] 16 (12/14 1820) BP: (119-151)/(68-69) 119/68 mmHg (12/14 1820) SpO2:  [95 %-96 %] 95 % (12/14 1820)  General: In bed, NAD CV: Regular rate and rhythm Mental Status: Patient is awake, alert, oriented to person, place, month, year, and situation. Immediate and remote memory are intact. Patient is able to give a clear and coherent history. No signs of aphasia or neglect Cranial Nerves: II: Visual Fields are full as patient can see in all 4 quadrants, but repeatedly answers one more finger then is being presented. Pupils are equal, round, and reactive to light.  Discs are sharp without signs of papilledema. There is no sign of afferent pupillary defect III,IV, VI: EOMI without ptosis or diploplia.  V:  Facial sensation is symmetric to temperature VII: Facial movement is symmetric.  VIII: hearing is intact to voice X: Uvula elevates symmetrically XI: Shoulder shrug is symmetric. XII: tongue is midline without atrophy or fasciculations.  Motor: Tone is normal. Bulk is normal. 5/5 strength was present on the left side, she has give way weakness on the right in both the arm and leg with inconsistencies on exam. This is more pronounced in the leg, with less obvious give way component in the arm Sensory: Sensation is symmetric to temperature Deep Tendon Reflexes: 2+ and symmetric in the biceps and patellae.  Cerebellar: FNF are intact bilaterally Gait: Patient became dizzy when stood, therefore no further attempt was made.   I have reviewed labs in epic and the results pertinent to this consultation are: CMP-unremarkable CBC-unremarkable  I have reviewed the images obtained: MRI C-spine-no spinal impingement or obvious cord signal change.  Impression: 52 year old female with headache, vision change, right-sided weakness and pain. There are some components to her exam that seem to reflect either embellishment or psychogenic etiology. However, I would favor ruling out other etiologies with an MRI of the brain with and without contrast as well as an MRI with postcontrast images of the cervical spine. My differential at this time would include complicated migraine, multiple sclerosis, neuromyelitis optica, conversion disorder.  Recommendations: 1) Toradol 30 mg plus Compazine 10 mg 2) MRI brain with/without contrast, MRI C-spine with contrast 3) if these images are negative, then I don't think that I would pursue  further workup as an inpatient, but patient couldn't arrange for outpatient followup if her symptoms persist. 4) could consider a Medrol Dosepak for status migrainosus.   Ritta Slot, MD [facility] [phone]  If 7pm- 7am, please page neurology on call at  [phone].

## 2013-01-13 NOTE — ED Provider Notes (Signed)
CSN: [id number]     Arrival date & time 01/13/13  1354 History   First MD Initiated Contact with Patient 01/13/13 1406     Chief Complaint  Patient presents with  . Dizziness  . Extremity Weakness   (Consider location/radiation/quality/duration/timing/severity/associated sxs/prior Treatment) Patient is a 52 y.o. female presenting with hip pain. The history is provided by the patient and medical records. The history is limited by a language barrier. A language interpreter was used.  Hip Pain Associated symptoms include weakness.   This is a 52 y.o. F with PMH significant for arrhythmia and HTN, presenting to the ED for dizziness, lightheadedness, and right sided weakness for the past 2 months.  States initially sx started with just intermittent dizziness which was worse with movement, especially head movements.  Then she began developing weakness of her left leg distal to the knee, then became diffuse throughout the entire right leg.  Over the past few weeks, dizziness has become constant and has developed right arm weakness.  Denies numbness or paresthesias.  Notes she often has sharp, stabbing pains in her head and mildly blurred vision-- states it feels different than a normal headache.  Denies photophobia, phonophobia, aura, tinnitus, confusion, or changes in speech.  No prior hx of TIA or stroke.  Not currently on any anti-coagulants.  Past Medical History  Diagnosis Date  . Arrhythmia   . Hypertension    Past Surgical History  Procedure Laterality Date  . Cardiac electrophysiology mapping and ablation    . Angioplasty  6 years ago    No family history on file. History  Substance Use Topics  . Smoking status: Never Smoker   . Smokeless tobacco: Not on file  . Alcohol Use: 1.8 oz/week    1 Glasses of wine, 2 Cans of beer per week     Comment: only drinks socially   OB History   Grav Para Term Preterm Abortions TAB SAB Ect Mult Living                 Review of Systems   Neurological: Positive for dizziness, weakness and light-headedness.  All other systems reviewed and are negative.    Allergies  Review of patient's allergies indicates no known allergies.  Home Medications   Current Outpatient Rx  Name  Route  Sig  Dispense  Refill  . B Complex-C (B-COMPLEX WITH VITAMIN C) tablet   Oral   Take 1 tablet by mouth daily.         . cyclobenzaprine (FLEXERIL) 10 MG tablet   Oral   Take 1 tablet (10 mg total) by mouth 2 (two) times daily as needed for muscle spasms.   20 tablet   0   . glucosamine-chondroitin 500-400 MG tablet   Oral   Take 1 tablet by mouth once.         Marland Kitchen HYDROcodone-acetaminophen (NORCO/VICODIN) 5-325 MG per tablet   Oral   Take 1-2 tablets by mouth every 6 (six) hours as needed for pain.   20 tablet   0   . triamterene-hydrochlorothiazide (MAXZIDE-25) 37.5-25 MG per tablet   Oral   Take 1 tablet by mouth daily.          BP 151/69  Pulse 78  Temp(Src) 97.9 F (36.6 C) (Oral)  Resp 20  SpO2 96%  LMP 12/24/2001  Physical Exam  Nursing note and vitals reviewed. Constitutional: She is oriented to person, place, and time. She appears well-developed and well-nourished. No distress.  HENT:  Head: Normocephalic and atraumatic.  Mouth/Throat: Oropharynx is clear and moist.  Eyes: Conjunctivae and EOM are normal. Pupils are equal, round, and reactive to light.  Neck: Normal range of motion. Neck supple.  Cardiovascular: Normal rate, regular rhythm and normal heart sounds.   Pulmonary/Chest: Effort normal and breath sounds normal. No respiratory distress. She has no wheezes.  Abdominal: Soft. Bowel sounds are normal. There is no tenderness. There is no guarding.  Musculoskeletal: Normal range of motion. She exhibits no edema.  Neurological: She is alert and oriented to person, place, and time. She displays no tremor. No cranial nerve deficit or sensory deficit. She displays no seizure activity.  Pt AAOx3,  answering questions appropriately via interpreter; CN grossly intact; moves all extremities without ataxia; decreased strength RUE and RLE when compared with left; finger to nose delayed on right, left finger to nose appropriate; no facial asymmetry; sensation to light touch intact diffusely  Skin: Skin is warm and dry. She is not diaphoretic.  Psychiatric: She has a normal mood and affect.    ED Course  Procedures (including critical care time) Labs Review Labs Reviewed  COMPREHENSIVE METABOLIC PANEL - Abnormal; Notable for the following:    Total Bilirubin 0.2 (*)    All other components within normal limits  URINALYSIS, ROUTINE W REFLEX MICROSCOPIC - Abnormal; Notable for the following:    Hgb urine dipstick MODERATE (*)    All other components within normal limits  URINE MICROSCOPIC-ADD ON - Abnormal; Notable for the following:    Squamous Epithelial / LPF FEW (*)    All other components within normal limits  CBC WITH DIFFERENTIAL  APTT  PROTIME-INR  TROPONIN I  ETHANOL  URINE RAPID DRUG SCREEN (HOSP PERFORMED)   Imaging Review Ct Head Wo Contrast  01/13/2013   CLINICAL DATA:  Right-sided weakness  EXAM: CT HEAD WITHOUT CONTRAST  TECHNIQUE: Contiguous axial images were obtained from the base of the skull through the vertex without intravenous contrast.  COMPARISON:  CT 04/28/2010  FINDINGS: Ventricle size is normal. Negative for acute or chronic infarction. Negative for hemorrhage or fluid collection. Negative for mass or edema. No shift of the midline structures.  Calvarium is intact.  IMPRESSION: Normal   Electronically Signed   By: Marlan Palau M.D.   On: 01/13/2013 15:13   Mr Cervical Spine Wo Contrast  01/13/2013   CLINICAL DATA:  Extremity weakness.  Dizziness.  EXAM: MRI CERVICAL SPINE WITHOUT CONTRAST  TECHNIQUE: Multiplanar, multisequence MR imaging was performed. No intravenous contrast was administered.  COMPARISON:  Cervical radiographs 12/31/2010  FINDINGS: Normal  cervical alignment. Negative for fracture or mass. Spinal cord signal is normal throughout. Craniocervical junction is normal.  C2-3:  Negative  C3-4:  Mild degenerative change without stenosis.  C4-5: Disc degeneration with mild spondylosis. Moderate left foraminal encroachment due to spurring  C5-6:  Mild disk degeneration  C6-7:  Mild disk degeneration  C7-T1:  Negative  IMPRESSION: Mild cervical degenerative changes. No significant stenosis. Moderate left foraminal encroachment at C4-5. Negative for disc protrusion.   Electronically Signed   By: Marlan Palau M.D.   On: 01/13/2013 18:53    EKG Interpretation    Date/Time:  Sunday January 13 2013 16:00:26 EST Ventricular Rate:  71 PR Interval:  166 QRS Duration: 92 QT Interval:  442 QTC Calculation: 480 R Axis:   62 Text Interpretation:  Sinus rhythm Confirmed by HORTON  MD, COURTNEY ([id number]) on 01/13/2013 5:22:16 PM  MDM  No diagnosis found.  On physical exam, right upper and lower extremities are with noticeable weakness when compared with left as well as some delayed coordination with RUE.  No prior hx of TIA or stroke, no anti-coagulants.  EKG NSR, no acute ischemic changes.  CT head negative for acute findings.  ASA given.  Labs as above.  Consulted neuro hospitalist Dr. Roseanne Reno-- advised to obtain MRI CS to r/o cord compression.  MRI CS negative for acute findings.  Consulted Dr. Amada Jupiter who has evaluated pt-- possibility symptoms due to neuromyelitis vs MS vs complex migraine vs psychogenic in origin.  Recommended MR brain with and w/out contrast and MRI CS with contrast for further evaluation.  Migraine cocktail given.  If imaging negative for acute findings he feels pt can be safely discharged with OP FU for neurology  Care signed out to PA Dammen at shift change.  Will follow MRI results and consult Dr. Amada Jupiter with any concerning findings.  Garlon Hatchet, PA-C 01/13/13 2034

## 2013-01-13 NOTE — ED Notes (Signed)
Attempted to call MRI x2 without answer. Charge RN informed. MDs to contact radiologist to obtain studies.

## 2013-01-13 NOTE — ED Notes (Signed)
Patient transported to CT 

## 2013-01-13 NOTE — ED Notes (Signed)
Pt states that she feels like the room is spinning and that she is off balance. States this has been ongoing on for appox 2 months. Tiredness, weakness, and dizziness is now constant for 2 days. Pt states that the weakness is on the right side starting in the knee and leg. Pt reports numbness in rt arm. Denies hx of stroke. No blood thinners used. reports decreased vision in rt eye. Pt takes medications for high BP.

## 2013-01-13 NOTE — ED Notes (Signed)
Pt in MRI will bring back to B 14

## 2013-01-14 MED ORDER — HYDROCODONE-ACETAMINOPHEN 5-325 MG PO TABS: 1.0000 | ORAL_TABLET | ORAL | Status: DC | PRN

## 2013-01-14 MED ORDER — MECLIZINE HCL 25 MG PO TABS: 25.0000 mg | ORAL_TABLET | Freq: Three times a day (TID) | ORAL | Status: DC | PRN

## 2013-01-14 MED ORDER — CYCLOBENZAPRINE HCL 10 MG PO TABS: 10.0000 mg | ORAL_TABLET | Freq: Three times a day (TID) | ORAL | Status: DC | PRN

## 2013-01-14 NOTE — ED Provider Notes (Signed)
Medical screening examination/treatment/procedure(s) were performed by non-physician practitioner and as supervising physician I was immediately available for consultation/collaboration.  EKG Interpretation    Date/Time:  Sunday January 13 2013 16:00:26 EST Ventricular Rate:  71 PR Interval:  166 QRS Duration: 92 QT Interval:  442 QTC Calculation: 480 R Axis:   62 Text Interpretation:  Sinus rhythm Confirmed by Wilkie Aye  MD, COURTNEY ([id number]) on 01/13/2013 5:22:16 PM              Dagmar Hait, MD 01/14/13 [phone]

## 2013-01-14 NOTE — ED Notes (Signed)
Pt return from MRI

## 2013-03-11 ENCOUNTER — Emergency Department (INDEPENDENT_AMBULATORY_CARE_PROVIDER_SITE_OTHER)
Admission: EM | Admit: 2013-03-11 | Discharge: 2013-03-11 | Disposition: A | Discharge status: Home / Self Care | Payer: No Typology Code available for payment source | Source: Home / Self Care | Attending: Family Medicine | Admitting: Family Medicine

## 2013-03-11 ENCOUNTER — Encounter (HOSPITAL_COMMUNITY): Payer: Self-pay | Admitting: Emergency Medicine

## 2013-03-11 DIAGNOSIS — R197 Diarrhea, unspecified: Secondary | ICD-10-CM

## 2013-03-11 LAB — COMPREHENSIVE METABOLIC PANEL
ALT: 24 U/L (ref 0–35)
AST: 18 U/L (ref 0–37)
Albumin: 3.7 g/dL (ref 3.5–5.2)
Alkaline Phosphatase: 73 U/L (ref 39–117)
BUN: 13 mg/dL (ref 6–23)
CO2: 24 meq/L (ref 19–32)
Calcium: 9.1 mg/dL (ref 8.4–10.5)
Chloride: 101 mEq/L (ref 96–112)
Creatinine, Ser: 0.57 mg/dL (ref 0.50–1.10)
GFR calc non Af Amer: >90 mL/min (ref >90)
GLUCOSE: 107 mg/dL — AB (ref 70–99)
POTASSIUM: 3.3 meq/L — AB (ref 3.7–5.3)
Sodium: 140 mEq/L (ref 137–147)
TOTAL PROTEIN: 7.9 g/dL (ref 6.0–8.3)
Total Bilirubin: 0.3 mg/dL (ref 0.3–1.2)

## 2013-03-11 LAB — CBC
HEMATOCRIT: 42.3 % (ref 36.0–46.0)
HEMOGLOBIN: 13.9 g/dL (ref 12.0–15.0)
MCH: 30.2 pg (ref 26.0–34.0)
MCHC: 32.9 g/dL (ref 30.0–36.0)
MCV: 91.8 fL (ref 78.0–100.0)
Platelets: 313 10*3/uL (ref 150–400)
RBC: 4.61 MIL/uL (ref 3.87–5.11)
RDW: 13.2 % (ref 11.5–15.5)
WBC: 6.5 10*3/uL (ref 4.0–10.5)

## 2013-03-11 LAB — LIPASE, BLOOD: LIPASE: 30 U/L (ref 11–59)

## 2013-03-11 LAB — CLOSTRIDIUM DIFFICILE BY PCR: CDIFFPCR: NEGATIVE

## 2013-03-11 NOTE — Discharge Instructions (Signed)
Gracias pro venir hoy.   Toxina de Clostridium difficile (Clostridium Difficile Toxin) Este anlisis puede hacerse cuando un paciente tiene diarrea durante varios [address]  [address]das o tiene dolor abdominal, fiebre y nuseas despus de recibir antibiticos.  El anlisis detecta la presencia de la toxina de Clostridium difficile (C.diff.) en [place] muestra de heces. La C.diff. es un microorganismo (bacteria) que forma parte de los grupos de bacterias que suelen estar presentes en el colon, denominadas "flora normal". Cuando algo afecta el crecimiento normal de la flora, puede producirse un crecimiento excesivo de C.diff., lo que altera el equilibrio bacteriano del colon. La C.diff. puede producir dos toxinas: A y B. La combinacin del crecimiento excesivo con las toxinas causa diarrea prolongada. Las toxinas pueden daar el recubrimiento del colon y causar colitis.  Si bien algunos casos de diarrea y colitis por C.diff. no requieren tratamiento, otros necesitan un tratamiento especfico con antibiticos por va oral. La mayora de los pacientes mejora a medida que la flora vuelve a la normalidad por s sola, pero algunos pueden tener una o ms recadas, con recurrencia de los sntomas y niveles detectables de toxinas. PREPARACIN PARA EL ESTUDIO No se requiere una preparacin especial para el anlisis. Se recoge una muestra de heces en un recipiente estril. Debe evitar que la Trill[place]uestra se mezcle con [place] o agua. Debe llevar la Lyn[place] Parentsmuestra de heces al laboratorio dentro de una hora. Puede refrigerarla o congelarla y llevarla al laboratorio tan pronto como sea posible. Los recipientes deben etiquetarse con su nombre y la fecha y la hora de Production managerrecoleccin de las heces.  HALLAZGOS NORMALES Cultivo negativo (sin identificacin de las toxinas) Los rangos para los resultados normales pueden variar entre diferentes laboratorios y hospitales. Consulte siempre con su mdico despus de Production assistant, radiohacer el estudio para Artistconocer el significado de los  Bel[place]resultados y si los valores se consideran "dentro de los lmites normales". SIGNIFICADO DEL ESTUDIO  El mdico leer los resultados y Heritage managerhablar con usted sobre la importancia y el significado de los [place]resultados, as como las opciones de tratamiento y la necesidad de Education officer, environmentalrealizar pruebas adicionales, si fuera necesario. OBTENCIN DE LOS RESULTADOS DE LAS PRUEBAS Es su responsabilidad retirar el resultado del [place]itolaestudio. Consulte en el laboratorio cundo y cmo podr Starbucks Corporationobtener los resultados. Document Released: 11/07/[address] [facility] Patient Information 2014 [place]ExitCare, Maryland[place].

## 2013-03-11 NOTE — ED Provider Notes (Signed)
Kiara Sanchez is a 53 y.o. female who presents to Urgent Care today for diarrhea. Patient has had watery diarrhea for about 3-4 weeks. This is associated with occasional abdominal cramping and pain. Additionally she notes intermittent fevers. She denies any blood in the stool. She denies any vomiting. She denies any trouble breathing cough or congestion. She's tried probiotics yesterday which have not helped. No new medications. No recent foreign travel. Patient feels well otherwise.   Past Medical History  Diagnosis Date  . Arrhythmia   . Hypertension    History  Substance Use Topics  . Smoking status: Never Smoker   . Smokeless tobacco: Not on file  . Alcohol Use: 1.8 oz/week    1 Glasses of wine, 2 Cans of beer per week     Comment: only drinks socially   ROS as above Medications: No current facility-administered medications for this encounter.   Current Outpatient Prescriptions  Medication Sig Dispense Refill  . triamterene-hydrochlorothiazide (MAXZIDE-25) 37.5-25 MG per tablet Take 1 tablet by mouth daily.      . citalopram (CELEXA) 40 MG tablet Take 40 mg by mouth daily.      . cyclobenzaprine (FLEXERIL) 10 MG tablet Take 1 tablet (10 mg total) by mouth 3 (three) times daily as needed for muscle spasms.  30 tablet  0  . ergocalciferol (VITAMIN D2) 50000 UNITS capsule Take 50,000 Units by mouth once a week.      Marland Kitchen. HYDROcodone-acetaminophen (NORCO/VICODIN) 5-325 MG per tablet Take 1-2 tablets by mouth every 4 (four) hours as needed for moderate pain.  20 tablet  0  . meclizine (ANTIVERT) 25 MG tablet Take 1 tablet (25 mg total) by mouth 3 (three) times daily as needed.  30 tablet  0    Exam:  BP 149/58  Pulse 74  Temp(Src) 97.7 F (36.5 C) (Oral)  Resp 16  SpO2 98%  LMP 12/24/2001 Gen: Well NAD HEENT: EOMI,  MMM Lungs: Normal work of breathing. CTABL Heart: RRR no MRG Abd: NABS, Soft. NT, ND no rebound or guarding. Exts: Brisk capillary refill, warm and well perfused.    Results for orders placed during the hospital encounter of 03/11/13 (from the past 24 hour(s))  CBC     Status: None   Collection Time    03/11/13 10:50 AM      Result Value Range   WBC 6.5  4.0 - 10.5 K/uL   RBC 4.61  3.87 - 5.11 MIL/uL   Hemoglobin 13.9  12.0 - 15.0 g/dL   HCT 16.142.3  09.636.0 - 04.546.0 %   MCV 91.8  78.0 - 100.0 fL   MCH 30.2  26.0 - 34.0 pg   MCHC 32.9  30.0 - 36.0 g/dL   RDW 40.913.2  81.111.5 - 91.415.5 %   Platelets 313  150 - 400 K/uL   No results found.  Assessment and Plan: 53 y.o. female with diarrhea. Unclear etiology. CMP, lipase, stool culture and Clostridium difficile PCR are pending. Will call patient's English-speaking sister with results [phone]. Recommended she followup with her primary care provider.  Discussed warning signs or symptoms. Please see discharge instructions. Patient expresses understanding.    Rodolph BongEvan S Corey, MD 03/11/13 [phone]

## 2013-03-11 NOTE — ED Notes (Signed)
C/o diarrhea over a month.  Fatigue/weakness in legs off/on.  Joint pain.  States shingles out break off/on. Rash under bra line.  No otc meds taken.

## 2013-03-15 ENCOUNTER — Telehealth (HOSPITAL_COMMUNITY): Payer: Self-pay | Admitting: Family Medicine

## 2013-03-15 LAB — STOOL CULTURE: Special Requests: NORMAL

## 2013-03-15 NOTE — ED Notes (Signed)
Called english speaking sister with the test results.  She is getting somewhat better. She still has some diarrhea.  Recommended imodium.  If that does not work will refer to gastroenterology.    Rodolph BongEvan S Carlisha Wisler, MD 03/15/13 [phone]

## 2013-04-03 ENCOUNTER — Institutional Professional Consult (permissible substitution): Payer: No Typology Code available for payment source | Admitting: Cardiology

## 2013-04-10 ENCOUNTER — Encounter (HOSPITAL_BASED_OUTPATIENT_CLINIC_OR_DEPARTMENT_OTHER): Payer: No Typology Code available for payment source

## 2013-06-12 ENCOUNTER — Encounter (HOSPITAL_BASED_OUTPATIENT_CLINIC_OR_DEPARTMENT_OTHER): Payer: Self-pay | Admitting: Emergency Medicine

## 2013-06-12 ENCOUNTER — Emergency Department (HOSPITAL_BASED_OUTPATIENT_CLINIC_OR_DEPARTMENT_OTHER)
Admission: EM | Admit: 2013-06-12 | Discharge: 2013-06-13 | Disposition: A | Discharge status: Home / Self Care | Payer: No Typology Code available for payment source | Attending: Emergency Medicine | Admitting: Emergency Medicine

## 2013-06-12 ENCOUNTER — Emergency Department (HOSPITAL_BASED_OUTPATIENT_CLINIC_OR_DEPARTMENT_OTHER): Payer: No Typology Code available for payment source

## 2013-06-12 DIAGNOSIS — Z90710 Acquired absence of both cervix and uterus: Secondary | ICD-10-CM | POA: Insufficient documentation

## 2013-06-12 DIAGNOSIS — Z9889 Other specified postprocedural states: Secondary | ICD-10-CM | POA: Insufficient documentation

## 2013-06-12 DIAGNOSIS — R1084 Generalized abdominal pain: Secondary | ICD-10-CM | POA: Insufficient documentation

## 2013-06-12 DIAGNOSIS — R143 Flatulence: Secondary | ICD-10-CM

## 2013-06-12 DIAGNOSIS — I10 Essential (primary) hypertension: Secondary | ICD-10-CM | POA: Insufficient documentation

## 2013-06-12 DIAGNOSIS — Z9861 Coronary angioplasty status: Secondary | ICD-10-CM | POA: Insufficient documentation

## 2013-06-12 DIAGNOSIS — Z79899 Other long term (current) drug therapy: Secondary | ICD-10-CM | POA: Insufficient documentation

## 2013-06-12 DIAGNOSIS — R141 Gas pain: Secondary | ICD-10-CM | POA: Insufficient documentation

## 2013-06-12 DIAGNOSIS — R109 Unspecified abdominal pain: Secondary | ICD-10-CM

## 2013-06-12 DIAGNOSIS — R142 Eructation: Secondary | ICD-10-CM

## 2013-06-12 LAB — URINE MICROSCOPIC-ADD ON

## 2013-06-12 LAB — COMPREHENSIVE METABOLIC PANEL
ALK PHOS: 61 U/L (ref 39–117)
ALT: 26 U/L (ref 0–35)
AST: 19 U/L (ref 0–37)
Albumin: 3.8 g/dL (ref 3.5–5.2)
BUN: 24 mg/dL — ABNORMAL HIGH (ref 6–23)
CALCIUM: 9.3 mg/dL (ref 8.4–10.5)
CO2: 26 mEq/L (ref 19–32)
Chloride: 102 mEq/L (ref 96–112)
Creatinine, Ser: 0.6 mg/dL (ref 0.50–1.10)
GFR calc Af Amer: >90 mL/min (ref >90)
Glucose, Bld: 106 mg/dL — ABNORMAL HIGH (ref 70–99)
Potassium: 3.7 mEq/L (ref 3.7–5.3)
SODIUM: 141 meq/L (ref 137–147)
Total Bilirubin: <0.2 mg/dL — ABNORMAL LOW (ref 0.3–1.2)
Total Protein: 7.5 g/dL (ref 6.0–8.3)

## 2013-06-12 LAB — CBC WITH DIFFERENTIAL/PLATELET
BASOS ABS: 0.1 10*3/uL (ref 0.0–0.1)
Basophils Relative: 0 % (ref 0–1)
EOS PCT: 1 % (ref 0–5)
Eosinophils Absolute: 0.1 10*3/uL (ref 0.0–0.7)
HCT: 38.9 % (ref 36.0–46.0)
Hemoglobin: 13.1 g/dL (ref 12.0–15.0)
LYMPHS ABS: 3.7 10*3/uL (ref 0.7–4.0)
Lymphocytes Relative: 30 % (ref 12–46)
MCH: 31.7 pg (ref 26.0–34.0)
MCHC: 33.7 g/dL (ref 30.0–36.0)
MCV: 94.2 fL (ref 78.0–100.0)
Monocytes Absolute: 1 10*3/uL (ref 0.1–1.0)
Monocytes Relative: 8 % (ref 3–12)
NEUTROS ABS: 7.6 10*3/uL (ref 1.7–7.7)
NEUTROS PCT: 61 % (ref 43–77)
PLATELETS: 330 10*3/uL (ref 150–400)
RBC: 4.13 MIL/uL (ref 3.87–5.11)
RDW: 13.2 % (ref 11.5–15.5)
WBC: 12.4 10*3/uL — AB (ref 4.0–10.5)

## 2013-06-12 LAB — URINALYSIS, ROUTINE W REFLEX MICROSCOPIC
BILIRUBIN URINE: NEGATIVE
Glucose, UA: NEGATIVE mg/dL
KETONES UR: NEGATIVE mg/dL
Leukocytes, UA: NEGATIVE
NITRITE: NEGATIVE
Protein, ur: NEGATIVE mg/dL
SPECIFIC GRAVITY, URINE: 1.012 (ref 1.005–1.030)
Urobilinogen, UA: 0.2 mg/dL (ref 0.0–1.0)
pH: 6.5 (ref 5.0–8.0)

## 2013-06-12 LAB — LIPASE, BLOOD: Lipase: 30 U/L (ref 11–59)

## 2013-06-12 MED ORDER — ONDANSETRON HCL 4 MG/2ML IJ SOLN
4.0000 mg | Freq: Once | INTRAMUSCULAR | Status: AC
  Administered 2013-06-12: 4 mg via INTRAVENOUS
  Filled 2013-06-12: qty 2

## 2013-06-12 MED ORDER — HYDROMORPHONE HCL PF 1 MG/ML IJ SOLN
1.0000 mg | Freq: Once | INTRAMUSCULAR | Status: AC
  Administered 2013-06-12: 1 mg via INTRAVENOUS
  Filled 2013-06-12: qty 1

## 2013-06-12 MED ORDER — IOHEXOL 300 MG/ML  SOLN
50.0000 mL | Freq: Once | INTRAMUSCULAR | Status: AC | PRN
  Administered 2013-06-12: 50 mL via ORAL

## 2013-06-12 MED ORDER — SODIUM CHLORIDE 0.9 % IV BOLUS (SEPSIS)
1000.0000 mL | Freq: Once | INTRAVENOUS | Status: AC
  Administered 2013-06-12: 1000 mL via INTRAVENOUS

## 2013-06-12 MED ORDER — IOHEXOL 300 MG/ML  SOLN
100.0000 mL | Freq: Once | INTRAMUSCULAR | Status: AC | PRN
  Administered 2013-06-12: 100 mL via INTRAVENOUS

## 2013-06-12 NOTE — ED Provider Notes (Addendum)
Nursing notes and vitals signs, including pulse oximetry, reviewed.  Summary of this visit's results, reviewed by myself:  Labs:  Results for orders placed during the hospital encounter of 06/12/13 (from the past 24 hour(s))  URINALYSIS, ROUTINE W REFLEX MICROSCOPIC     Status: Abnormal   Collection Time    06/12/13  9:10 PM      Result Value Ref Range   Color, Urine YELLOW  YELLOW   APPearance CLEAR  CLEAR   Specific Gravity, Urine 1.012  1.005 - 1.030   pH 6.5  5.0 - 8.0   Glucose, UA NEGATIVE  NEGATIVE mg/dL   Hgb urine dipstick TRACE (*) NEGATIVE   Bilirubin Urine NEGATIVE  NEGATIVE   Ketones, ur NEGATIVE  NEGATIVE mg/dL   Protein, ur NEGATIVE  NEGATIVE mg/dL   Urobilinogen, UA 0.2  0.0 - 1.0 mg/dL   Nitrite NEGATIVE  NEGATIVE   Leukocytes, UA NEGATIVE  NEGATIVE  URINE MICROSCOPIC-ADD ON     Status: None   Collection Time    06/12/13  9:10 PM      Result Value Ref Range   Squamous Epithelial / LPF RARE  RARE   RBC / HPF 0-2  <3 RBC/hpf   Bacteria, UA RARE  RARE  CBC WITH DIFFERENTIAL     Status: Abnormal   Collection Time    06/12/13 10:30 PM      Result Value Ref Range   WBC 12.4 (*) 4.0 - 10.5 K/uL   RBC 4.13  3.87 - 5.11 MIL/uL   Hemoglobin 13.1  12.0 - 15.0 g/dL   HCT 04.538.9  40.936.0 - 81.146.0 %   MCV 94.2  78.0 - 100.0 fL   MCH 31.7  26.0 - 34.0 pg   MCHC 33.7  30.0 - 36.0 g/dL   RDW 91.413.2  78.211.5 - 95.615.5 %   Platelets 330  150 - 400 K/uL   Neutrophils Relative % 61  43 - 77 %   Neutro Abs 7.6  1.7 - 7.7 K/uL   Lymphocytes Relative 30  12 - 46 %   Lymphs Abs 3.7  0.7 - 4.0 K/uL   Monocytes Relative 8  3 - 12 %   Monocytes Absolute 1.0  0.1 - 1.0 K/uL   Eosinophils Relative 1  0 - 5 %   Eosinophils Absolute 0.1  0.0 - 0.7 K/uL   Basophils Relative 0  0 - 1 %   Basophils Absolute 0.1  0.0 - 0.1 K/uL  COMPREHENSIVE METABOLIC PANEL     Status: Abnormal   Collection Time    06/12/13 10:30 PM      Result Value Ref Range   Sodium 141  137 - 147 mEq/L   Potassium 3.7   3.7 - 5.3 mEq/L   Chloride 102  96 - 112 mEq/L   CO2 26  19 - 32 mEq/L   Glucose, Bld 106 (*) 70 - 99 mg/dL   BUN 24 (*) 6 - 23 mg/dL   Creatinine, Ser 2.130.60  0.50 - 1.10 mg/dL   Calcium 9.3  8.4 - 08.610.5 mg/dL   Total Protein 7.5  6.0 - 8.3 g/dL   Albumin 3.8  3.5 - 5.2 g/dL   AST 19  0 - 37 U/L   ALT 26  0 - 35 U/L   Alkaline Phosphatase 61  39 - 117 U/L   Total Bilirubin <0.2 (*) 0.3 - 1.2 mg/dL   GFR calc non Af Amer >90  >90 mL/min  GFR calc Af Amer >90  >90 mL/min  LIPASE, BLOOD     Status: None   Collection Time    06/12/13 10:30 PM      Result Value Ref Range   Lipase 30  11 - 59 U/L    Imaging Studies: Ct Abdomen Pelvis W Contrast  06/12/2013   CLINICAL DATA:  Mid abdominal pain.  Distention.  EXAM: CT ABDOMEN AND PELVIS WITH CONTRAST  TECHNIQUE: Multidetector CT imaging of the abdomen and pelvis was performed using the standard protocol following bolus administration of intravenous contrast.  CONTRAST:  50mL OMNIPAQUE IOHEXOL 300 MG/ML SOLN, 100mL OMNIPAQUE IOHEXOL 300 MG/ML SOLN  COMPARISON:  DG LUMBAR SPINE COMPLETE dated 12/25/2011; CT ABDOMEN W/O CM dated 01/21/2009  FINDINGS: Dependent atelectasis in the lung bases.  The liver, spleen, gallbladder, pancreas, adrenal glands, kidneys, abdominal aorta, inferior vena cava, and retroperitoneal lymph nodes are unremarkable. Stomach, small bowel, and colon are unremarkable. No free air or free fluid in the abdomen. Minimal umbilical hernia containing fat. Dystrophic appearing calcification in the right lobe of the liver.  Pelvis: Bladder wall is not thickened. Uterus appears surgically absent. No pelvic mass or lymphadenopathy. No evidence of diverticulitis. Appendix is not identified. Degenerative changes in the spine. No destructive bone lesions appreciated.  IMPRESSION: No acute process demonstrated in the abdomen or pelvis.   Electronically Signed   By: Burman NievesWilliam  Stevens M.D.   On: 06/12/2013 23:51   12:04 AM Patient states pain  is better. Abdomen is soft with mid upper tenderness. She was advised of CT findings. She's not currently on any ulcer medication, we will start her on a PPI and have her follow up with her PCP.   Hanley SeamenJohn L Kailin Leu, MD 06/12/13 2359  Hanley SeamenJohn L Marvetta Vohs, MD 06/13/13 [id number]

## 2013-06-12 NOTE — ED Notes (Signed)
Abd pain and distension since yesterday. Also pain in low back.

## 2013-06-12 NOTE — ED Provider Notes (Signed)
CSN: [id number]     Arrival date & time 06/12/13  2056 History  This chart was scribed for Kiara B. Bernette MayersSheldon, MD by Dorothey Basemania Sutton, ED Scribe. This patient was seen in room MH06/MH06 and the patient's care was started at 10:13 PM.    Chief Complaint  Patient presents with  . Abdominal Pain   The history is provided by the patient and the spouse. The history is limited by a language barrier. No language interpreter was used.   HPI Comments: Kiara Sanchez is a 53 y.o. female who presents to the Emergency Department complaining of pain to the generalized abdomen with associated pain to the lower back onset last night. Her husband reports that the patient has been evaluated for similar complaints in the past and received an x-ray that indicated an ulcer. She denies emesis, diarrhea, constipation. Patient has a surgical history of abdominal hysterectomy. Patient also has a history of HTN and arrhythmia. Patient is an occasional, social drinker. Patient's history limited by a language barrier, but she agreed to have her family members (son and husband) translate.   Past Medical History  Diagnosis Date  . Arrhythmia   . Hypertension    Past Surgical History  Procedure Laterality Date  . Cardiac electrophysiology mapping and ablation    . Angioplasty  6 years ago   . Abdominal hysterectomy     No family history on file. History  Substance Use Topics  . Smoking status: Never Smoker   . Smokeless tobacco: Not on file  . Alcohol Use: 1.8 oz/week    1 Glasses of wine, 2 Cans of beer per week     Comment: only drinks socially   OB History   Grav Para Term Preterm Abortions TAB SAB Ect Mult Living                 Review of Systems  A complete 10 system review of systems was obtained and all systems are negative except as noted in the HPI and PMH.    Allergies  Review of patient's allergies indicates no known allergies.  Home Medications   Prior to Admission medications   Medication Sig  Start Date End Date Taking? Authorizing Provider  gabapentin (NEURONTIN) 100 MG capsule Take 100 mg by mouth 2 (two) times daily.   Yes Historical Provider, MD  metoprolol succinate (TOPROL-XL) 25 MG 24 hr tablet Take 25 mg by mouth daily.   Yes Historical Provider, MD  citalopram (CELEXA) 40 MG tablet Take 40 mg by mouth daily.    Historical Provider, MD  cyclobenzaprine (FLEXERIL) 10 MG tablet Take 1 tablet (10 mg total) by mouth 3 (three) times daily as needed for muscle spasms. 01/14/13   Phill MutterPeter S Dammen, PA-C  ergocalciferol (VITAMIN D2) 50000 UNITS capsule Take 50,000 Units by mouth once a week.    Historical Provider, MD  HYDROcodone-acetaminophen (NORCO/VICODIN) 5-325 MG per tablet Take 1-2 tablets by mouth every 4 (four) hours as needed for moderate pain. 01/14/13   Angus SellerPeter S Dammen, PA-C  meclizine (ANTIVERT) 25 MG tablet Take 1 tablet (25 mg total) by mouth 3 (three) times daily as needed. 01/14/13   Phill MutterPeter S Dammen, PA-C  triamterene-hydrochlorothiazide (MAXZIDE-25) 37.5-25 MG per tablet Take 1 tablet by mouth daily.    Historical Provider, MD   Triage Vitals: BP 128/62  Pulse 69  Temp(Src) 98.4 F (36.9 C) (Oral)  Resp 16  Ht 5\' 4"  (1.626 m)  Wt 170 lb (77.111 kg)  BMI 29.17 kg/m2  SpO2 98%  LMP 12/24/2001  Physical Exam  Nursing note and vitals reviewed. Constitutional: She is oriented to person, place, and time. She appears well-developed and well-nourished.  HENT:  Head: Normocephalic and atraumatic.  Eyes: EOM are normal. Pupils are equal, round, and reactive to light.  Neck: Normal range of motion. Neck supple.  Cardiovascular: Normal rate, normal heart sounds and intact distal pulses.   Pulmonary/Chest: Effort normal and breath sounds normal.  Abdominal: Bowel sounds are normal. She exhibits distension. There is tenderness.  Mild distention and generalized tenderness.   Musculoskeletal: Normal range of motion. She exhibits no edema and no tenderness.  Neurological: She  is alert and oriented to person, place, and time. She has normal strength. No cranial nerve deficit or sensory deficit.  Skin: Skin is warm and dry. No rash noted.  Psychiatric: She has a normal mood and affect.    ED Course  Procedures (including critical care time)  DIAGNOSTIC STUDIES: Oxygen Saturation is 98% on room air, normal by my interpretation.    COORDINATION OF CARE: 9:10 PM- Ordered UA and urine microscopic.   10:17 PM- Will order a CT of the abdomen, CBC, CMP, lipase. Will order IV fluids, Zofran, and Dilaudid to manage symptoms. Discussed treatment plan with patient at bedside and patient verbalized agreement.     Labs Review Labs Reviewed  URINALYSIS, ROUTINE W REFLEX MICROSCOPIC - Abnormal; Notable for the following:    Hgb urine dipstick TRACE (*)    All other components within normal limits  CBC WITH DIFFERENTIAL - Abnormal; Notable for the following:    WBC 12.4 (*)    All other components within normal limits  COMPREHENSIVE METABOLIC PANEL - Abnormal; Notable for the following:    Glucose, Bld 106 (*)    BUN 24 (*)    Total Bilirubin <0.2 (*)    All other components within normal limits  URINE MICROSCOPIC-ADD ON  LIPASE, BLOOD    Imaging Review No results found.   EKG Interpretation None      MDM   Final diagnoses:  Abdominal pain    Care signed out to Dr. Read DriversMolpus at shift change.   I personally performed the services described in this documentation, which was scribed in my presence. The recorded information has been reviewed and is accurate.       Kiara B. Bernette MayersSheldon, MD 06/18/13 [phone]

## 2013-06-13 MED ORDER — PANTOPRAZOLE SODIUM 40 MG IV SOLR
40.0000 mg | Freq: Once | INTRAVENOUS | Status: AC
  Administered 2013-06-13: 40 mg via INTRAVENOUS
  Filled 2013-06-13: qty 40

## 2013-06-13 MED ORDER — DIPHENHYDRAMINE HCL 50 MG/ML IJ SOLN
25.0000 mg | Freq: Once | INTRAMUSCULAR | Status: AC
  Administered 2013-06-13: 25 mg via INTRAVENOUS
  Filled 2013-06-13: qty 1

## 2013-06-13 MED ORDER — PANTOPRAZOLE SODIUM 40 MG PO TBEC: 40.0000 mg | DELAYED_RELEASE_TABLET | Freq: Every day | ORAL | Status: DC

## 2013-06-13 NOTE — Discharge Instructions (Signed)
Dolor abdominal en adultos  (Abdominal Pain, Adult)  El dolor puede tener muchas causas. Normalmente la causa del dolor abdominal no es una enfermedad y mejorará sin tratamiento. Frecuentemente puede controlarse y tratarse en casa. Su médico le realizará un examen físico y posiblemente solicite análisis de sangre y radiografías para ayudar a determinar la gravedad de su dolor. Sin embargo, en muchos casos, debe transcurrir más tiempo antes de que se pueda encontrar una causa evidente del dolor. Antes de llegar a ese punto, es posible que su médico no sepa si necesita más pruebas o un tratamiento más profundo.  INSTRUCCIONES PARA EL CUIDADO EN EL HOGAR   Esté atento al dolor para ver si hay cambios. Las siguientes indicaciones ayudarán a aliviar cualquier molestia que pueda sentir:  · Tome solo medicamentos de venta libre o recetados, según las indicaciones del médico.  · No tome laxantes a menos que se lo haya indicado su médico.  · Pruebe con una dieta líquida absoluta (caldo, té o agua) según se lo indique su médico. Introduzca gradualmente una dieta normal, según su tolerancia.  SOLICITE ATENCIÓN MÉDICA SI:  · Tiene dolor abdominal sin explicación.  · Tiene dolor abdominal relacionado con náuseas o diarrea.  · Tiene dolor cuando orina o defeca.  · Experimenta dolor abdominal que lo despierta de noche.  · Tiene dolor abdominal que empeora o mejora cuando come alimentos.  · Tiene dolor abdominal que empeora cuando come alimentos grasosos.  SOLICITE ATENCIÓN MÉDICA DE INMEDIATO SI:   · El dolor no desaparece en un plazo máximo de 2 horas.  · Tiene fiebre.  · No deja de (vomitar).  · El dolor se siente solo en partes del abdomen, como el lado derecho o la parte inferior izquierda del abdomen.  · Evacúa materia fecal sanguinolenta o negra, de aspecto alquitranado.  ASEGÚRESE DE QUE:  · Comprende estas instrucciones.  · Controlará su afección.  · Recibirá ayuda de inmediato si no mejora o si empeora.  Document  Released: 01/17/2005 Document Revised: 11/07/2012  Exit[employer]® Patient Information ©2014 [employer].

## 2013-06-13 NOTE — ED Notes (Signed)
MD at bedside. 

## 2013-06-26 ENCOUNTER — Encounter: Payer: Self-pay | Admitting: *Deleted

## 2013-06-27 ENCOUNTER — Ambulatory Visit (INDEPENDENT_AMBULATORY_CARE_PROVIDER_SITE_OTHER): Payer: No Typology Code available for payment source | Admitting: Neurology

## 2013-06-27 ENCOUNTER — Encounter: Payer: Self-pay | Admitting: Neurology

## 2013-06-27 VITALS — BP 121/58 | HR 65 | Ht 63.75 in | Wt 175.0 lb

## 2013-06-27 DIAGNOSIS — R52 Pain, unspecified: Secondary | ICD-10-CM

## 2013-06-27 DIAGNOSIS — R209 Unspecified disturbances of skin sensation: Secondary | ICD-10-CM

## 2013-06-27 DIAGNOSIS — R202 Paresthesia of skin: Secondary | ICD-10-CM | POA: Insufficient documentation

## 2013-06-27 DIAGNOSIS — R29898 Other symptoms and signs involving the musculoskeletal system: Secondary | ICD-10-CM

## 2013-06-27 DIAGNOSIS — R2681 Unsteadiness on feet: Secondary | ICD-10-CM

## 2013-06-27 DIAGNOSIS — R269 Unspecified abnormalities of gait and mobility: Secondary | ICD-10-CM

## 2013-06-27 MED ORDER — GABAPENTIN 100 MG PO CAPS: 100.0000 mg | ORAL_CAPSULE | Freq: Three times a day (TID) | ORAL | Status: DC

## 2013-06-27 NOTE — Progress Notes (Signed)
[facility]    Provider:  Dr Hosie PoissonSumner Referring Provider: Jearld LeschWilliams, Dwight M, MD Primary Care Physician:  Alois ClicheAguilar, Tracey, PA-C  CC:  Back pain  HPI:  Levonne HubertMaria Lank is a 53 y.o. female here as a referral from Dr. Mayford KnifeWilliams for right hip pain.   Notes pain started around 1 year ago. Told by PCP she has a pinched nerve. The pain radiates down her entire leg, down the posterior surface down into her toes. Pain described as a sharp stabbing sensation.  Notes her foot feels weak. Notes some weakness in the leg. Having difficulty walking due to pain and weakness, has had some falls. Denies any history of trauma or injury to the back. Denies any saddle anesthesia, no change in bowel/bladder.   Reviewed X-ray of lumbar spine which was overall unremarkable.   Review of Systems: Out of a complete 14 system review, the patient complains of only the following symptoms, and all other reviewed systems are negative. + feeling hot, SOB, joint pain, cramps, aching muscles, decreased energy  History   Social History  . Marital Status: Married    Spouse Name: N/A    Number of Children: N/A  . Years of Education: N/A   Occupational History  . Not on file.   Social History Main Topics  . Smoking status: Never Smoker   . Smokeless tobacco: Never Used  . Alcohol Use: No  . Drug Use: No  . Sexual Activity: Yes    Birth Control/ Protection: None, Surgical     Comment: unable to ask in traige d/t son with pt and husband   Other Topics Concern  . Not on file   Social History Narrative   Married with 1 child   Right handed   12 th grade   1 cup daily    No family history on file.  Past Medical History  Diagnosis Date  . Arrhythmia   . Hypertension     Past Surgical History  Procedure Laterality Date  . Cardiac electrophysiology mapping and ablation    . Angioplasty  6 years ago   . Abdominal hysterectomy      Current Outpatient Prescriptions  Medication Sig  Dispense Refill  . citalopram (CELEXA) 40 MG tablet Take 40 mg by mouth daily.      . cyclobenzaprine (FLEXERIL) 10 MG tablet Take 1 tablet (10 mg total) by mouth 3 (three) times daily as needed for muscle spasms.  30 tablet  0  . ergocalciferol (VITAMIN D2) 50000 UNITS capsule Take 50,000 Units by mouth once a week.      . gabapentin (NEURONTIN) 100 MG capsule Take 100 mg by mouth 2 (two) times daily.      . indomethacin (INDOCIN) 25 MG capsule Take 25 mg by mouth 2 (two) times daily with a meal.      . loratadine (CLARITIN) 10 MG tablet       . meclizine (ANTIVERT) 25 MG tablet Take 1 tablet (25 mg total) by mouth 3 (three) times daily as needed.  30 tablet  0  . metoprolol succinate (TOPROL-XL) 25 MG 24 hr tablet Take 25 mg by mouth daily.      Marland Kitchen. NITROSTAT 0.4 MG SL tablet       . pantoprazole (PROTONIX) 40 MG tablet Take 1 tablet (40 mg total) by mouth daily.  30 tablet  0  . triamterene-hydrochlorothiazide (MAXZIDE-25) 37.5-25 MG per tablet Take 1 tablet by mouth daily.       No  current facility-administered medications for this visit.    Allergies as of 06/27/2013  . (No Known Allergies)    Vitals: BP 121/58  Pulse 65  Ht 5' 3.75" (1.619 m)  Wt 175 lb (79.379 kg)  BMI 30.28 kg/m2  LMP 12/24/2001 Last Weight:  Wt Readings from Last 1 Encounters:  06/27/13 175 lb (79.379 kg)   Last Height:   Ht Readings from Last 1 Encounters:  06/27/13 5' 3.75" (1.619 m)     Physical exam: Exam: Gen: NAD, conversant Eyes: anicteric sclerae, moist conjunctivae HENT: Atraumatic, oropharynx clear Neck: Trachea midline; supple,  Lungs: CTA, no wheezing, rales, rhonic                          CV: RRR, no MRG Abdomen: Soft, non-tender;  Extremities: No peripheral edema  Skin: Normal temperature, no rash,  Psych: Appropriate affect, pleasant  Neuro: MS: AA&Ox3, appropriately interactive, normal affect   Speech: fluent w/o paraphasic error  Memory: good recent and remote  recall  CN: PERRL, EOMI no nystagmus, no ptosis, sensation intact to LT V1-V3 bilat, face symmetric, no weakness, hearing grossly intact, palate elevates symmetrically, shoulder shrug 5/5 bilat,  tongue protrudes midline, no fasiculations noted.  Motor: normal bulk and tone Strength: 5/5  In all extremities with giveaway weakness in whole right lower extremity  Coord: rapid alternating and point-to-point (FNF, HTS) movements intact.  Reflexes: symmetrical, bilat downgoing toes  Sens: LT intact in all extremities, intact PP, temp, vibration and proprioception bilateral lower extremties  Gait: posture, stance, stride and arm-swing normal. Tandem gait intact. Able to walk on heels and toes. Romberg absent.   Assessment:  After physical and neurologic examination, review of laboratory studies, imaging, neurophysiology testing and pre-existing records, assessment will be reviewed on the problem list.  Plan:  Treatment plan and additional workup will be reviewed under Problem List.  1)Lower extremity pain 2)Weakness 3)Paresthesias  53y/o woman presenting for initial evaluation of right lower extremity pain and weakness. Based on pain distribution down posterior surface this appears to be a sciatica type pain, though generalized LE weakness is not consistent with this. Will check EMG/NCS. Depending on results can consider MRI L spine. Will increase gabapentin to 100mg  TID. Will refer to physical therapy. Follow up as needed.     Elspeth Cho, DO  [facility] [address] [place], [place] [address]  Phone [phone] Fax [phone]

## 2013-06-27 NOTE — Patient Instructions (Signed)
Overall you are doing fairly well but I do want to suggest a few things today:   Remember to drink plenty of fluid, eat healthy meals and do not skip any meals. Try to eat protein with a every meal and eat a healthy snack such as fruit or nuts in between meals. Try to keep a regular sleep-wake schedule and try to exercise daily, particularly in the form of walking, 20-30 minutes a day, if you can.   As far as your medications are concerned, I would like to suggest you increase the gabapentin to 100mg  three times a day  I would like you to schedule an EMG/NCS when you check out  I would like you to work with physical therapy, they will call you to schedule  Follow up after EMG/NCS. Please call us with any interim questions, concerns, problems, updates or refill requests.   Please also call us for any test results so we can go over those with you on the phone.  My clinical assistant and will answer any of your questions and relay your messages to me and also relay most of my messages to you.   Our phone number is [phone]. We also have an after hours call service for urgent matters and there is a physician on-call for urgent questions. For any emergencies you know to call 911 or go to the nearest emergency room

## 2013-07-09 ENCOUNTER — Encounter (INDEPENDENT_AMBULATORY_CARE_PROVIDER_SITE_OTHER): Payer: Self-pay | Admitting: Radiology

## 2013-07-09 ENCOUNTER — Ambulatory Visit (INDEPENDENT_AMBULATORY_CARE_PROVIDER_SITE_OTHER): Payer: No Typology Code available for payment source | Admitting: Diagnostic Neuroimaging

## 2013-07-09 DIAGNOSIS — R52 Pain, unspecified: Secondary | ICD-10-CM

## 2013-07-09 DIAGNOSIS — R29898 Other symptoms and signs involving the musculoskeletal system: Secondary | ICD-10-CM

## 2013-07-09 DIAGNOSIS — Z0289 Encounter for other administrative examinations: Secondary | ICD-10-CM

## 2013-07-09 DIAGNOSIS — R2681 Unsteadiness on feet: Secondary | ICD-10-CM

## 2013-07-09 NOTE — Procedures (Signed)
   [facility]  NCS (NERVE CONDUCTION STUDY) WITH EMG (ELECTROMYOGRAPHY) REPORT   STUDY DATE: 07/09/13 PATIENT NAME: Azyiah Sallee DOB: 09/04/1960 MRN: [id number]  ORDERING CLINICIAN: Elspeth Cho, DO   TECHNOLOGIST: Kaylyn Lim  ELECTROMYOGRAPHER: Glenford Bayley. Penumalli, MD  CLINICAL INFORMATION: 53 year old female with right hip and leg pain and numbness.  FINDINGS: NERVE CONDUCTION STUDY: Right peroneal and right tibial motor responses and F-wave latencies are normal. Bilateral H reflex responses are normal. Right sural and peroneal sensory responses are normal.  NEEDLE ELECTROMYOGRAPHY: Needle examination of right lower extremity, vastus medialis, tibialis anterior, gastric images and right L4-5 paraspinal muscles is normal.  IMPRESSION:  This is a normal study. No electrodiagnostic evidence of large fiber neuropathy or right lumbar radiculopathy at this time.   INTERPRETING PHYSICIAN:  Suanne Marker, MD Certified in Neurology, Neurophysiology and Neuroimaging  [facility] [address] [place], [place] [address] [phone]

## 2013-07-10 NOTE — Progress Notes (Signed)
Quick Note:  Called patient and left voice message of normal EMG/NCS, also informed her that she is being sent to physical therapy and she will be contacted with appointment information, and if PT does not improve her symptoms then he will order an MRI, call back with any questions or concerns. ______

## 2013-07-25 ENCOUNTER — Other Ambulatory Visit: Payer: Self-pay | Admitting: *Deleted

## 2013-07-31 ENCOUNTER — Encounter (INDEPENDENT_AMBULATORY_CARE_PROVIDER_SITE_OTHER): Payer: No Typology Code available for payment source

## 2013-07-31 ENCOUNTER — Encounter: Payer: Self-pay | Admitting: Radiology

## 2013-07-31 ENCOUNTER — Encounter: Payer: Self-pay | Admitting: Cardiology

## 2013-07-31 ENCOUNTER — Ambulatory Visit (INDEPENDENT_AMBULATORY_CARE_PROVIDER_SITE_OTHER): Payer: No Typology Code available for payment source | Admitting: Cardiology

## 2013-07-31 VITALS — BP 112/66 | HR 80 | Ht 63.75 in | Wt 176.0 lb

## 2013-07-31 DIAGNOSIS — I2510 Atherosclerotic heart disease of native coronary artery without angina pectoris: Secondary | ICD-10-CM

## 2013-07-31 DIAGNOSIS — I209 Angina pectoris, unspecified: Secondary | ICD-10-CM

## 2013-07-31 DIAGNOSIS — I25119 Atherosclerotic heart disease of native coronary artery with unspecified angina pectoris: Secondary | ICD-10-CM

## 2013-07-31 DIAGNOSIS — I10 Essential (primary) hypertension: Secondary | ICD-10-CM | POA: Insufficient documentation

## 2013-07-31 DIAGNOSIS — E785 Hyperlipidemia, unspecified: Secondary | ICD-10-CM

## 2013-07-31 DIAGNOSIS — R0602 Shortness of breath: Secondary | ICD-10-CM

## 2013-07-31 DIAGNOSIS — R002 Palpitations: Secondary | ICD-10-CM

## 2013-07-31 MED ORDER — ASPIRIN EC 81 MG PO TBEC: 81.0000 mg | DELAYED_RELEASE_TABLET | Freq: Every day | ORAL | Status: DC

## 2013-07-31 MED ORDER — ATORVASTATIN CALCIUM 20 MG PO TABS: 20.0000 mg | ORAL_TABLET | Freq: Every day | ORAL | Status: DC

## 2013-07-31 NOTE — Patient Instructions (Addendum)
Your physician has recommended you make the following change in your medication:   START TAKING LIPITOR 20 MG DAILY  START TAKING A 81 MG ASPIRIN DAILY  Your physician has recommended that you wear a 48 HOUR holter monitor. Holter monitors are medical devices that record the heart's electrical activity. Doctors most often use these monitors to diagnose arrhythmias. Arrhythmias are problems with the speed or rhythm of the heartbeat. The monitor is a small, portable device. You can wear one while you do your normal daily activities. This is usually used to diagnose what is causing palpitations/syncope (passing out).  Your physician has requested that you have en exercise stress myoview. For further information please visit [email or web address]. Please follow instruction sheet, as given.   Your physician recommends that you schedule a follow-up appointment in: WITH DR Delton SeeNELSON PENDING YOUR TEST  [facility]  Your physician recommends that you return for lab work in: IN ONE MONTH (CMP)

## 2013-07-31 NOTE — Addendum Note (Signed)
Addended by: Loa SocksMARTIN, Aliciana Ricciardi M on: 07/31/2013 09:29 AM   Modules accepted: Orders

## 2013-07-31 NOTE — Progress Notes (Signed)
Patient ID: Kiara HubertMaria Bedel, female   DOB: [birth year], 53 y.o.   MRN: [id number] E cardio 48hr holter applied

## 2013-07-31 NOTE — Progress Notes (Signed)
Patient ID: [name], female   DOB: 21-Feb-1960, 53 y.o.   MRN: [id number]    Patient Name: Kiara Sanchez Date of Encounter: 07/31/2013  Primary Care Provider:  Alois Cliche, PA-C Primary Cardiologist:  Lars Masson  Problem List   Past Medical History  Diagnosis Date  . Hypertension   . Sleep apnea   . Anxiety   . Depression    Past Surgical History  Procedure Laterality Date  . Cardiac electrophysiology mapping and ablation    . Angioplasty  6 years ago   . Abdominal hysterectomy     Allergies  No Known Allergies  HPI  53 year old female from [place] who is coming with complaints of shortness of breath and tachycardia. The patient has history of cardiac catheterization at [facility] in [place] but she's not sure if any intervention was performed. She states that she gets exertional chest pain but also chest pain at rest accompanied with shortness of breath that has progressed over the last year. Currently she gets short of breath when she walks one flight of stairs. Patient also states that she has been experiencing a lot of tachycardia palpitations that appear on exertion or at rest and can last up to several minutes. They are associated with dizziness and mild shortness of breath. These palpitations start all sudden. No episode of syncope.    Home Medications  Prior to Admission medications   Medication Sig Start Date End Date Taking? Authorizing Provider  gabapentin (NEURONTIN) 100 MG capsule Take 1 capsule (100 mg total) by mouth 3 (three) times daily. 06/27/13  Yes Omelia Blackwater, DO  loratadine (CLARITIN) 10 MG tablet  05/21/13  Yes Historical Provider, MD  metoprolol succinate (TOPROL-XL) 25 MG 24 hr tablet Take 25 mg by mouth daily.   Yes Historical Provider, MD  triamterene-hydrochlorothiazide (MAXZIDE-25) 37.5-25 MG per tablet Take 1 tablet by mouth daily.   Yes Historical Provider, MD    Family History  No family history on  file.  Social History  History   Social History  . Marital Status: Married    Spouse Name: N/A    Number of Children: N/A  . Years of Education: N/A   Occupational History  . Not on file.   Social History Main Topics  . Smoking status: Never Smoker   . Smokeless tobacco: Never Used  . Alcohol Use: No  . Drug Use: No  . Sexual Activity: Yes    Birth Control/ Protection: None, Surgical     Comment: unable to ask in traige d/t son with pt and husband   Other Topics Concern  . Not on file   Social History Narrative   Married with 1 child   Right handed   12 th grade   1 cup daily     Review of Systems, as per HPI, otherwise negative General:  No chills, fever, night sweats or weight changes.  Cardiovascular:  No chest pain, dyspnea on exertion, edema, orthopnea, palpitations, paroxysmal nocturnal dyspnea. Dermatological: No rash, lesions/masses Respiratory: No cough, dyspnea Urologic: No hematuria, dysuria Abdominal:   No nausea, vomiting, diarrhea, bright red blood per rectum, melena, or hematemesis Neurologic:  No visual changes, wkns, changes in mental status. All other systems reviewed and are otherwise negative except as noted above.  Physical Exam  Blood pressure 112/66, pulse 80, height 5' 3.75" (1.619 m), weight 176 lb (79.833 kg), last menstrual period 12/24/2001.  General: Pleasant, NAD Psych: Normal affect. Neuro: Alert and oriented X 3. Moves  all extremities spontaneously. HEENT: Normal  Neck: Supple without bruits or JVD. Lungs:  Resp regular and unlabored, CTA. Heart: RRR no s3, s4, or murmurs. Abdomen: Soft, non-tender, non-distended, BS + x 4.  Extremities: No clubbing, cyanosis or edema. DP/PT/Radials 2+ and equal bilaterally.  Labs:  No results found for this basename: CKTOTAL, CKMB, TROPONINI,  in the last 72 hours Lab Results  Component Value Date   WBC 12.4* 06/12/2013   HGB 13.1 06/12/2013   HCT 38.9 06/12/2013   MCV 94.2 06/12/2013    PLT 330 06/12/2013    Lab Results  Component Value Date   DDIMER  Value: <0.22        AT THE INHOUSE ESTABLISHED CUTOFF VALUE OF 0.48 ug/mL FEU, THIS ASSAY HAS BEEN DOCUMENTED IN THE LITERATURE TO HAVE A SENSITIVITY AND NEGATIVE PREDICTIVE VALUE OF AT LEAST 98 TO 99%.  THE TEST RESULT SHOULD BE CORRELATED WITH AN ASSESSMENT OF THE CLINICAL PROBABILITY OF DVT / VTE. 04/23/2009   No components found with this basename: POCBNP,     Component Value Date/Time   NA 141 06/12/2013 2230   K 3.7 06/12/2013 2230   CL 102 06/12/2013 2230   CO2 26 06/12/2013 2230   GLUCOSE 106* 06/12/2013 2230   BUN 24* 06/12/2013 2230   CREATININE 0.60 06/12/2013 2230   CALCIUM 9.3 06/12/2013 2230   PROT 7.5 06/12/2013 2230   ALBUMIN 3.8 06/12/2013 2230   AST 19 06/12/2013 2230   ALT 26 06/12/2013 2230   ALKPHOS 61 06/12/2013 2230   BILITOT <0.2* 06/12/2013 2230   GFRNONAA >90 06/12/2013 2230   GFRAA >90 06/12/2013 2230   Lab Results  Component Value Date   CHOL  Value: 162        ATP III CLASSIFICATION:  <200     mg/dL   Desirable  161-096200-239  mg/dL   Borderline High  >=045>=240    mg/dL   High        4/09/81193/25/2011   HDL 39* 04/24/2009   LDLCALC  Value: 91        Total Cholesterol/HDL:CHD Risk Coronary Heart Disease Risk Table                     Men   Women  1/2 Average Risk   3.4   3.3  Average Risk       5.0   4.4  2 X Average Risk   9.6   7.1  3 X Average Risk  23.4   11.0        Use the calculated Patient Ratio above and the CHD Risk Table to determine the patient's CHD Risk.        ATP III CLASSIFICATION (LDL):  <100     mg/dL   Optimal  147-829100-129  mg/dL   Near or Above                    Optimal  130-159  mg/dL   Borderline  562-130160-189  mg/dL   High  >865>190     mg/dL   Very High 7/84/69623/25/2011   TRIG 162* 04/24/2009     Accessory Clinical Findings  Echocardiogram - none  ECG - in 2014 - SR, nonspecific ST-T wave abnormalities    Assessment & Plan  53 year old female  1. CAD - prior cardiac catheterization and unknown history of  intervention. [facility] We will continue metoprolol and add aspirin 81 mg  daily and atorvastatin 20 mg daily. Because of significant chest pain and shortness of breath on exertion we will order an exercise nuclear stress test to evaluate for ischemia.  2. Hypertension - controlled  3. Palpitations - normal TSH, we will order a 48 hour Holter monitor. Continue beta blocker.  4. Hyperlipidemia - triglycerides 162, HDL 46, LDL 146. Considering patient has elevated blood glucose we will for moderate does of hypodensity statins and will start with atorvastatin 20 mg daily we'll check her comprehensive metabolic profile in 3 weeks.  Follow up in 2 months.  Lars MassonNELSON, Jasminemarie Sherrard H, MD, [facility] 07/31/2013, 8:27 AM

## 2013-07-31 NOTE — Addendum Note (Signed)
Addended by: Loa SocksMARTIN, IVY M on: 07/31/2013 12:41 PM   Modules accepted: Orders

## 2013-08-08 ENCOUNTER — Ambulatory Visit (HOSPITAL_COMMUNITY): Payer: No Typology Code available for payment source | Attending: Cardiology | Admitting: Radiology

## 2013-08-08 VITALS — BP 158/91 | HR 65 | Ht 64.0 in | Wt 176.0 lb

## 2013-08-08 DIAGNOSIS — I25119 Atherosclerotic heart disease of native coronary artery with unspecified angina pectoris: Secondary | ICD-10-CM

## 2013-08-08 DIAGNOSIS — R0989 Other specified symptoms and signs involving the circulatory and respiratory systems: Secondary | ICD-10-CM | POA: Insufficient documentation

## 2013-08-08 DIAGNOSIS — R0609 Other forms of dyspnea: Secondary | ICD-10-CM | POA: Insufficient documentation

## 2013-08-08 DIAGNOSIS — R079 Chest pain, unspecified: Secondary | ICD-10-CM | POA: Insufficient documentation

## 2013-08-08 DIAGNOSIS — R9431 Abnormal electrocardiogram [ECG] [EKG]: Secondary | ICD-10-CM | POA: Insufficient documentation

## 2013-08-08 DIAGNOSIS — Z9861 Coronary angioplasty status: Secondary | ICD-10-CM | POA: Insufficient documentation

## 2013-08-08 DIAGNOSIS — R0602 Shortness of breath: Secondary | ICD-10-CM | POA: Insufficient documentation

## 2013-08-08 DIAGNOSIS — I2510 Atherosclerotic heart disease of native coronary artery without angina pectoris: Secondary | ICD-10-CM | POA: Insufficient documentation

## 2013-08-08 DIAGNOSIS — I10 Essential (primary) hypertension: Secondary | ICD-10-CM | POA: Insufficient documentation

## 2013-08-08 DIAGNOSIS — R002 Palpitations: Secondary | ICD-10-CM | POA: Insufficient documentation

## 2013-08-08 DIAGNOSIS — R42 Dizziness and giddiness: Secondary | ICD-10-CM | POA: Insufficient documentation

## 2013-08-08 MED ORDER — REGADENOSON 0.4 MG/5ML IV SOLN
0.4000 mg | Freq: Once | INTRAVENOUS | Status: AC
  Administered 2013-08-08: 0.4 mg via INTRAVENOUS

## 2013-08-08 MED ORDER — TECHNETIUM TC 99M SESTAMIBI GENERIC - CARDIOLITE
33.0000 | Freq: Once | INTRAVENOUS | Status: AC | PRN
  Administered 2013-08-08: 33 via INTRAVENOUS

## 2013-08-08 MED ORDER — TECHNETIUM TC 99M SESTAMIBI GENERIC - CARDIOLITE
11.0000 | Freq: Once | INTRAVENOUS | Status: AC | PRN
  Administered 2013-08-08: 11 via INTRAVENOUS

## 2013-08-08 NOTE — Progress Notes (Signed)
[facility] SITE 3 NUCLEAR MED [address] [place], [place] [address] [phone]    Cardiology Nuclear Med Study  Levonne HubertMaria Sanchez is a 53 y.o. female     MRN : [id number]     DOB: Dec 05, 1960  Procedure Date: 08/08/2013  Nuclear Med Background Indication for Stress Test:  Evaluation for Ischemia, Abnormal EKG and NS ST, T changes History:  CAD, Cath (in [place]), PTCA, MPI 4 yrs. ago, Ablation for tachycardia ([facility]) Cardiac Risk Factors: Hypertension and Lipids  Symptoms:  Chest Pain, Dizziness, DOE, Palpitations and SOB   Nuclear Pre-Procedure Caffeine/Decaff Intake:  None NPO After: 7:00pm   Lungs:  clear O2 Sat: 94% on room air. IV 0.9% NS with Angio Cath:  22g  IV Site: R Antecubital  IV Started by:  Milana NaSabrina Williams, EMT-P  Chest Size (in):  36 Cup Size: D  Height: 5\' 4"  (1.626 m)  Weight:  176 lb (79.833 kg)  BMI:  Body mass index is 30.2 kg/(m^2). Tech Comments:  No Rx this am    Nuclear Med Study 1 or 2 day study: 1 day  Stress Test Type:  Eugenie BirksLexiscan  Reading MD: n/a  Order Authorizing Provider:  K.Nelson MD  Resting Radionuclide: Technetium 1781m Sestamibi  Resting Radionuclide Dose: 11.0 mCi   Stress Radionuclide:  Technetium 7381m Sestamibi  Stress Radionuclide Dose: 33.0 mCi           Stress Protocol Rest HR: 65 Stress HR: 93  Rest BP: 158/91 Stress BP: 131/71  Exercise Time (min): n/a METS: n/a           Dose of Adenosine (mg):  n/a Dose of Lexiscan: 0.4 mg  Dose of Atropine (mg): n/a Dose of Dobutamine: n/a mcg/kg/min (at max HR)  Stress Test Technologist: Nelson ChimesSharon Brooks, BS-ES  Nuclear Technologist:  Doyne Keelonya Yount, CNMT     Rest Procedure:  Myocardial perfusion imaging was performed at rest 45 minutes following the intravenous administration of Technetium 6381m Sestamibi. Rest ECG: NSR - Normal EKG  Stress Procedure:  The patient received IV Lexiscan 0.4 mg over 15-seconds.  Technetium 3481m Sestamibi injected at 30-seconds.  Quantitative spect  images were obtained after a 45 minute delay.  During the infusion of Lexiscan the patient complained of SOB, back burning, chest tightness and dizziness.  These symptoms began to resolve in recovery.    Stress ECG: Insignificant upsloping ST segment depression.  QPS Raw Data Images:  There is interference from nuclear activity from structures below the diaphragm. This does not affect the ability to read the study.  There is also some breast attenuation.  Stress Images:  There is a small area of  mild attenuation of the distal anterior wal with normal uptake in the other regions.  Rest Images:  There is a small area of  mild attenuation of the distal anterior wal with normal uptake in the other regions. Subtraction (SDS):  No evidence of ischemia. Transient Ischemic Dilatation (Normal <1.22):  0.86 Lung/Heart Ratio (Normal <0.45):  0.25  Quantitative Gated Spect Images QGS EDV:  109 ml QGS ESV:  37 ml  Impression Exercise Capacity:  Lexiscan with no exercise. BP Response:  Normal blood pressure response. Clinical Symptoms:  No significant symptoms noted. ECG Impression:  No significant ST segment change suggestive of ischemia. Comparison with Prior Nuclear Study: No images to compare  Overall Impression:  Normal stress nuclear study.  There is mild attenuation of the distal anterior wall this is most consistent with breast artifact.  LV Ejection Fraction: 66%.  LV Wall Motion:  NL LV Function; NL Wall Motion.   Vesta Mixer, Montez Hageman., MD, [facility] 08/08/2013, 4:40 PM [address]  [address] Office [phone] Pager [phone]

## 2013-08-12 ENCOUNTER — Telehealth: Payer: Self-pay | Admitting: Cardiology

## 2013-08-12 NOTE — Telephone Encounter (Signed)
Spoke with Husband (EC)- and told him per Dr Delton SeeNelson the pts stress test results were normal.

## 2013-08-12 NOTE — Telephone Encounter (Signed)
New message     Want wife's stress test results

## 2013-08-28 ENCOUNTER — Telehealth: Payer: Self-pay

## 2013-08-28 NOTE — Telephone Encounter (Signed)
Message copied by Jarvis NewcomerPARRIS-GODLEY, Florencio Hollibaugh S on Wed Aug 28, 2013 12:29 PM ------      Message from: Lars MassonNELSON, KATARINA H      Created: Tue Aug 27, 2013  1:43 PM       Please let her know that her Holter is completely normal.      Thank you,      Aris LotKatarina ------

## 2013-08-28 NOTE — Telephone Encounter (Signed)
called to give holter results.holter was reviewed by Dr.Nelson and is normal. lmtcb

## 2013-09-03 ENCOUNTER — Other Ambulatory Visit: Payer: No Typology Code available for payment source

## 2013-09-10 ENCOUNTER — Other Ambulatory Visit (INDEPENDENT_AMBULATORY_CARE_PROVIDER_SITE_OTHER): Payer: No Typology Code available for payment source

## 2013-09-10 DIAGNOSIS — I209 Angina pectoris, unspecified: Secondary | ICD-10-CM

## 2013-09-10 DIAGNOSIS — R002 Palpitations: Secondary | ICD-10-CM

## 2013-09-10 DIAGNOSIS — I2510 Atherosclerotic heart disease of native coronary artery without angina pectoris: Secondary | ICD-10-CM

## 2013-09-10 DIAGNOSIS — I10 Essential (primary) hypertension: Secondary | ICD-10-CM

## 2013-09-10 DIAGNOSIS — E785 Hyperlipidemia, unspecified: Secondary | ICD-10-CM

## 2013-09-10 DIAGNOSIS — I25119 Atherosclerotic heart disease of native coronary artery with unspecified angina pectoris: Secondary | ICD-10-CM

## 2013-09-10 DIAGNOSIS — R0602 Shortness of breath: Secondary | ICD-10-CM

## 2013-09-10 LAB — COMPREHENSIVE METABOLIC PANEL
ALT: 26 U/L (ref 0–35)
AST: 21 U/L (ref 0–37)
Albumin: 4.2 g/dL (ref 3.5–5.2)
Alkaline Phosphatase: 55 U/L (ref 39–117)
BUN: 22 mg/dL (ref 6–23)
CO2: 23 mEq/L (ref 19–32)
Calcium: 9.2 mg/dL (ref 8.4–10.5)
Chloride: 101 mEq/L (ref 96–112)
Creatinine, Ser: 0.8 mg/dL (ref 0.4–1.2)
GFR: 85.92 mL/min (ref >60.00)
Glucose, Bld: 91 mg/dL (ref 70–99)
Potassium: 3.8 mEq/L (ref 3.5–5.1)
Sodium: 137 mEq/L (ref 135–145)
Total Bilirubin: 0.6 mg/dL (ref 0.2–1.2)
Total Protein: 8.1 g/dL (ref 6.0–8.3)

## 2013-09-11 ENCOUNTER — Encounter: Payer: Self-pay | Admitting: *Deleted

## 2013-09-11 ENCOUNTER — Telehealth: Payer: Self-pay | Admitting: *Deleted

## 2013-09-11 NOTE — Telephone Encounter (Signed)
Message copied by Loa SocksMARTIN, Juanito Gonyer M on Wed Sep 11, 2013 11:10 AM ------      Message from: Lars MassonNELSON, KATARINA H      Created: Tue Sep 10, 2013  5:23 PM       All labs normal including electrolytes, liver and function test. Please let her know.      K ------

## 2013-09-11 NOTE — Telephone Encounter (Signed)
Have called pt multiple times to inform her of her normal labs- normal electrolytes and liver function test, per Dr Delton SeeNelson.  Will further follow-up with the pt by sending her a result letter in the mail.

## 2013-09-17 NOTE — Telephone Encounter (Signed)
Returned pts call and informed him that pts recent holter results were normal per Dr Delton SeeNelson. Husband verbalized understanding and pleased with the f/u.

## 2013-09-17 NOTE — Telephone Encounter (Signed)
Follow up     Husband calling to speak with nurse.

## 2014-10-27 ENCOUNTER — Inpatient Hospital Stay (HOSPITAL_BASED_OUTPATIENT_CLINIC_OR_DEPARTMENT_OTHER)
Admission: EM | Admit: 2014-10-27 | Discharge: 2014-10-28 | DRG: 203 | Disposition: A | Discharge status: Home / Self Care | Payer: 59 | Attending: Internal Medicine | Admitting: Internal Medicine

## 2014-10-27 ENCOUNTER — Encounter (HOSPITAL_BASED_OUTPATIENT_CLINIC_OR_DEPARTMENT_OTHER): Payer: Self-pay | Admitting: Emergency Medicine

## 2014-10-27 ENCOUNTER — Emergency Department (HOSPITAL_BASED_OUTPATIENT_CLINIC_OR_DEPARTMENT_OTHER): Payer: 59

## 2014-10-27 DIAGNOSIS — R05 Cough: Secondary | ICD-10-CM | POA: Diagnosis not present

## 2014-10-27 DIAGNOSIS — E780 Pure hypercholesterolemia: Secondary | ICD-10-CM | POA: Diagnosis present

## 2014-10-27 DIAGNOSIS — R072 Precordial pain: Secondary | ICD-10-CM | POA: Diagnosis not present

## 2014-10-27 DIAGNOSIS — R059 Cough, unspecified: Secondary | ICD-10-CM | POA: Diagnosis present

## 2014-10-27 DIAGNOSIS — R509 Fever, unspecified: Secondary | ICD-10-CM | POA: Diagnosis not present

## 2014-10-27 DIAGNOSIS — G4733 Obstructive sleep apnea (adult) (pediatric): Secondary | ICD-10-CM | POA: Diagnosis present

## 2014-10-27 DIAGNOSIS — R079 Chest pain, unspecified: Secondary | ICD-10-CM | POA: Diagnosis present

## 2014-10-27 DIAGNOSIS — K219 Gastro-esophageal reflux disease without esophagitis: Secondary | ICD-10-CM | POA: Diagnosis present

## 2014-10-27 DIAGNOSIS — Z23 Encounter for immunization: Secondary | ICD-10-CM | POA: Diagnosis not present

## 2014-10-27 DIAGNOSIS — F329 Major depressive disorder, single episode, unspecified: Secondary | ICD-10-CM | POA: Diagnosis present

## 2014-10-27 DIAGNOSIS — I25810 Atherosclerosis of coronary artery bypass graft(s) without angina pectoris: Secondary | ICD-10-CM | POA: Diagnosis present

## 2014-10-27 DIAGNOSIS — Z955 Presence of coronary angioplasty implant and graft: Secondary | ICD-10-CM | POA: Diagnosis not present

## 2014-10-27 DIAGNOSIS — I10 Essential (primary) hypertension: Secondary | ICD-10-CM

## 2014-10-27 DIAGNOSIS — E876 Hypokalemia: Secondary | ICD-10-CM | POA: Diagnosis present

## 2014-10-27 DIAGNOSIS — F32A Depression, unspecified: Secondary | ICD-10-CM | POA: Diagnosis present

## 2014-10-27 DIAGNOSIS — E785 Hyperlipidemia, unspecified: Secondary | ICD-10-CM

## 2014-10-27 DIAGNOSIS — J40 Bronchitis, not specified as acute or chronic: Secondary | ICD-10-CM | POA: Diagnosis present

## 2014-10-27 DIAGNOSIS — F419 Anxiety disorder, unspecified: Secondary | ICD-10-CM | POA: Diagnosis present

## 2014-10-27 DIAGNOSIS — Z90710 Acquired absence of both cervix and uterus: Secondary | ICD-10-CM | POA: Diagnosis not present

## 2014-10-27 DIAGNOSIS — I2510 Atherosclerotic heart disease of native coronary artery without angina pectoris: Secondary | ICD-10-CM | POA: Diagnosis present

## 2014-10-27 DIAGNOSIS — Z79899 Other long term (current) drug therapy: Secondary | ICD-10-CM | POA: Diagnosis not present

## 2014-10-27 DIAGNOSIS — M1990 Unspecified osteoarthritis, unspecified site: Secondary | ICD-10-CM | POA: Diagnosis present

## 2014-10-27 DIAGNOSIS — I700 Atherosclerosis of aorta: Secondary | ICD-10-CM | POA: Diagnosis present

## 2014-10-27 DIAGNOSIS — Z7982 Long term (current) use of aspirin: Secondary | ICD-10-CM

## 2014-10-27 HISTORY — DX: Atherosclerotic heart disease of native coronary artery without angina pectoris: I25.10

## 2014-10-27 HISTORY — DX: Pure hypercholesterolemia, unspecified: E78.00

## 2014-10-27 HISTORY — DX: Dependence on other enabling machines and devices: Z99.89

## 2014-10-27 HISTORY — DX: Unspecified osteoarthritis, unspecified site: M19.90

## 2014-10-27 HISTORY — DX: Pneumonia, unspecified organism: J18.9

## 2014-10-27 HISTORY — DX: Calculus of kidney: N20.0

## 2014-10-27 HISTORY — DX: Family history of other specified conditions: Z84.89

## 2014-10-27 HISTORY — DX: Gastro-esophageal reflux disease without esophagitis: K21.9

## 2014-10-27 HISTORY — DX: Obstructive sleep apnea (adult) (pediatric): G47.33

## 2014-10-27 LAB — CBC WITH DIFFERENTIAL/PLATELET
BASOS PCT: 1 %
Basophils Absolute: 0.1 10*3/uL (ref 0.0–0.1)
EOS ABS: 0.1 10*3/uL (ref 0.0–0.7)
Eosinophils Relative: 1 %
HCT: 40.9 % (ref 36.0–46.0)
HEMOGLOBIN: 13.3 g/dL (ref 12.0–15.0)
Lymphocytes Relative: 19 %
Lymphs Abs: 1.6 10*3/uL (ref 0.7–4.0)
MCH: 29.8 pg (ref 26.0–34.0)
MCHC: 32.5 g/dL (ref 30.0–36.0)
MCV: 91.7 fL (ref 78.0–100.0)
MONO ABS: 0.9 10*3/uL (ref 0.1–1.0)
Monocytes Relative: 11 %
NEUTROS PCT: 68 %
Neutro Abs: 5.7 10*3/uL (ref 1.7–7.7)
PLATELETS: 329 10*3/uL (ref 150–400)
RBC: 4.46 MIL/uL (ref 3.87–5.11)
RDW: 13.5 % (ref 11.5–15.5)
WBC: 8.4 10*3/uL (ref 4.0–10.5)

## 2014-10-27 LAB — TROPONIN I

## 2014-10-27 LAB — COMPREHENSIVE METABOLIC PANEL
ALT: 30 U/L (ref 14–54)
AST: 25 U/L (ref 15–41)
Albumin: 4.3 g/dL (ref 3.5–5.0)
Alkaline Phosphatase: 62 U/L (ref 38–126)
Anion gap: 10 (ref 5–15)
BUN: 22 mg/dL — ABNORMAL HIGH (ref 6–20)
CO2: 25 mmol/L (ref 22–32)
Calcium: 9.3 mg/dL (ref 8.9–10.3)
Chloride: 103 mmol/L (ref 101–111)
Creatinine, Ser: 0.73 mg/dL (ref 0.44–1.00)
GFR calc Af Amer: >60 mL/min (ref >60)
GFR calc non Af Amer: >60 mL/min (ref >60)
Glucose, Bld: 93 mg/dL (ref 65–99)
Potassium: 3.4 mmol/L — ABNORMAL LOW (ref 3.5–5.1)
Sodium: 138 mmol/L (ref 135–145)
Total Bilirubin: 0.5 mg/dL (ref 0.3–1.2)
Total Protein: 8 g/dL (ref 6.5–8.1)

## 2014-10-27 MED ORDER — HEPARIN (PORCINE) IN NACL 100-0.45 UNIT/ML-% IJ SOLN
1000.0000 [IU]/h | INTRAMUSCULAR | Status: DC
  Administered 2014-10-27: 1000 [IU]/h via INTRAVENOUS
  Filled 2014-10-27: qty 250

## 2014-10-27 MED ORDER — ASPIRIN EC 325 MG PO TBEC
325.0000 mg | DELAYED_RELEASE_TABLET | Freq: Once | ORAL | Status: AC
  Administered 2014-10-27: 325 mg via ORAL
  Filled 2014-10-27: qty 1

## 2014-10-27 MED ORDER — GABAPENTIN 100 MG PO CAPS
100.0000 mg | ORAL_CAPSULE | Freq: Three times a day (TID) | ORAL | Status: DC
  Filled 2014-10-27: qty 1

## 2014-10-27 MED ORDER — ASPIRIN EC 81 MG PO TBEC
81.0000 mg | DELAYED_RELEASE_TABLET | Freq: Every day | ORAL | Status: DC
  Administered 2014-10-28: 81 mg via ORAL
  Filled 2014-10-27: qty 1

## 2014-10-27 MED ORDER — SODIUM CHLORIDE 0.9 % IJ SOLN
3.0000 mL | Freq: Two times a day (BID) | INTRAMUSCULAR | Status: DC
  Administered 2014-10-27: 3 mL via INTRAVENOUS

## 2014-10-27 MED ORDER — PANTOPRAZOLE SODIUM 40 MG IV SOLR
40.0000 mg | Freq: Every day | INTRAVENOUS | Status: DC
  Administered 2014-10-28: 40 mg via INTRAVENOUS
  Filled 2014-10-27: qty 40

## 2014-10-27 MED ORDER — HYDRALAZINE HCL 20 MG/ML IJ SOLN: 5.0000 mg | INTRAMUSCULAR | Status: DC | PRN

## 2014-10-27 MED ORDER — POTASSIUM CHLORIDE CRYS ER 20 MEQ PO TBCR
20.0000 meq | EXTENDED_RELEASE_TABLET | Freq: Once | ORAL | Status: AC
Start: 2014-10-27 — End: 2014-10-28
  Administered 2014-10-28: 20 meq via ORAL
  Filled 2014-10-27: qty 1

## 2014-10-27 MED ORDER — MORPHINE SULFATE (PF) 2 MG/ML IV SOLN: 2.0000 mg | INTRAVENOUS | Status: DC | PRN

## 2014-10-27 MED ORDER — MORPHINE SULFATE (PF) 2 MG/ML IV SOLN: 2.0000 mg | Freq: Once | INTRAVENOUS | Status: DC

## 2014-10-27 MED ORDER — ALBUTEROL SULFATE (2.5 MG/3ML) 0.083% IN NEBU: 2.5000 mg | INHALATION_SOLUTION | RESPIRATORY_TRACT | Status: DC | PRN

## 2014-10-27 MED ORDER — LORATADINE 10 MG PO TABS: 10.0000 mg | ORAL_TABLET | Freq: Every day | ORAL | Status: DC | PRN

## 2014-10-27 MED ORDER — ACETAMINOPHEN 325 MG PO TABS
650.0000 mg | ORAL_TABLET | Freq: Four times a day (QID) | ORAL | Status: DC | PRN
  Administered 2014-10-28: 650 mg via ORAL
  Filled 2014-10-27: qty 2

## 2014-10-27 MED ORDER — HEPARIN BOLUS VIA INFUSION
4000.0000 [IU] | Freq: Once | INTRAVENOUS | Status: AC
  Administered 2014-10-27: 4000 [IU] via INTRAVENOUS
  Filled 2014-10-27: qty 4000

## 2014-10-27 MED ORDER — IOHEXOL 350 MG/ML SOLN
100.0000 mL | Freq: Once | INTRAVENOUS | Status: AC | PRN
  Administered 2014-10-27: 100 mL via INTRAVENOUS

## 2014-10-27 MED ORDER — METOPROLOL SUCCINATE ER 25 MG PO TB24: 25.0000 mg | ORAL_TABLET | Freq: Every day | ORAL | Status: DC

## 2014-10-27 MED ORDER — AZITHROMYCIN 250 MG PO TABS
500.0000 mg | ORAL_TABLET | Freq: Every day | ORAL | Status: AC
  Administered 2014-10-28: 500 mg via ORAL
  Filled 2014-10-27: qty 2

## 2014-10-27 MED ORDER — NITROGLYCERIN IN D5W 200-5 MCG/ML-% IV SOLN
2.0000 ug/min | INTRAVENOUS | Status: DC
  Administered 2014-10-28: 2 ug/min via INTRAVENOUS
  Filled 2014-10-27: qty 250

## 2014-10-27 MED ORDER — ACETAMINOPHEN 650 MG RE SUPP: 650.0000 mg | Freq: Four times a day (QID) | RECTAL | Status: DC | PRN

## 2014-10-27 MED ORDER — CITALOPRAM HYDROBROMIDE 20 MG PO TABS: 40.0000 mg | ORAL_TABLET | Freq: Every day | ORAL | Status: DC

## 2014-10-27 MED ORDER — INFLUENZA VAC SPLIT QUAD 0.5 ML IM SUSY
0.5000 mL | PREFILLED_SYRINGE | INTRAMUSCULAR | Status: AC
  Administered 2014-10-28: 0.5 mL via INTRAMUSCULAR
  Filled 2014-10-27: qty 0.5

## 2014-10-27 MED ORDER — DM-GUAIFENESIN ER 30-600 MG PO TB12
1.0000 | ORAL_TABLET | Freq: Two times a day (BID) | ORAL | Status: DC
  Administered 2014-10-28: 1 via ORAL
  Filled 2014-10-27: qty 1

## 2014-10-27 MED ORDER — SODIUM CHLORIDE 0.9 % IV SOLN
INTRAVENOUS | Status: DC
  Administered 2014-10-27: via INTRAVENOUS

## 2014-10-27 MED ORDER — METOPROLOL SUCCINATE ER 25 MG PO TB24
25.0000 mg | ORAL_TABLET | Freq: Every day | ORAL | Status: DC
Start: 2014-10-27 — End: 2014-10-28
  Administered 2014-10-28: 25 mg via ORAL
  Filled 2014-10-27: qty 1

## 2014-10-27 MED ORDER — MORPHINE SULFATE (PF) 4 MG/ML IV SOLN
4.0000 mg | Freq: Once | INTRAVENOUS | Status: AC
  Administered 2014-10-27: 4 mg via INTRAVENOUS
  Filled 2014-10-27: qty 1

## 2014-10-27 MED ORDER — PANTOPRAZOLE SODIUM 40 MG IV SOLR: 40.0000 mg | INTRAVENOUS | Status: DC

## 2014-10-27 MED ORDER — CITALOPRAM HYDROBROMIDE 20 MG PO TABS
40.0000 mg | ORAL_TABLET | Freq: Every day | ORAL | Status: DC
  Filled 2014-10-27: qty 2

## 2014-10-27 MED ORDER — AZITHROMYCIN 250 MG PO TABS: 250.0000 mg | ORAL_TABLET | Freq: Every day | ORAL | Status: DC

## 2014-10-27 MED ORDER — ATORVASTATIN CALCIUM 80 MG PO TABS: 80.0000 mg | ORAL_TABLET | Freq: Every day | ORAL | Status: DC

## 2014-10-27 NOTE — ED Notes (Signed)
Central chest pain radiating to left arm, neck, back, and jaws.  Some diaphoresis, some weakness, some sob.

## 2014-10-27 NOTE — H&P (Addendum)
[facility] History and Physical  Kiara Sanchez ZOX:[id number] DOB: 1960/07/20 DOA: 10/27/2014  Referring physician: ED physician PCP: Kiara Rankin, PA-C  Specialists:   Chief Complaint: chest pain, cough, fever  HPI: Kiara Sanchez is a 54 y.o. female with PMH of hypertension, hyperlipidemia, OSA, GERD, depression, anxiety, CAD (s/p of stent), who presents with chest pain, cough and fever.  Pt does not speak [place]. She is accompanied by her husband who speaks good [place]. History was obtained through his husband's translation. She started having chest pain 2 days ago. Her chest is located substernal area, constant, 8 of 10 in severity, radiating to back, neck and left jaw. It is associated with mild nausea, but no vomiting, abdominal pain or diarrhea. She has dry cough, mild shortness stress and fever. She does not have dysuria or burning on urination, but has increased urinary frequency which she attributes to the use of Maxzide. She states that she has "clot" in the back of left knee. She report that she had "Scan" in a walking clinic on [address] (not remember the name) due to left leg pain behind her knee at 1.5 months ago. She was started with ASA. Currently no leg pain. Patient does not have rashes, unilateral weakness.  In ED, patient was found to have negative troponin, WBC 8.4, temperature 100.1, tachycardia, potassium 3.4, negative chest x-ray. CT angiogram of the chest was suboptimal study, but did showed aortic dissection or large pulmonary embolism, showed a amall hiatal hernia. Patient is admitted for further evaluation treatment.  Where does patient live?   At home   Can patient participate in ADLs?  Yes  Review of Systems:   General: has fevers, chills, no changes in body weight, has poor appetite, has fatigue HEENT: no blurry vision, hearing changes or sore throat Pulm: has mild dyspnea, coughing, no wheezing CV: has chest pain, no palpitations Abd: has mild  nausea, no vomiting, abdominal pain, diarrhea, constipation GU: no dysuria, burning on urination, has increased urinary frequency, no hematuria  Ext: no leg edema Neuro: no unilateral weakness, numbness, or tingling, no vision change or hearing loss Skin: no rash MSK: No muscle spasm, no deformity, no limitation of range of movement in spin Heme: No easy bruising.  Travel history: No recent long distant travel.  Allergy: No Known Allergies  Past Medical History  Diagnosis Date  . Hypertension   . Coronary artery disease   . Family history of adverse reaction to anesthesia     "sister almost stopped breathing"   . Hypercholesterolemia   . Pneumonia ~ 2014  . OSA on CPAP   . Arthritis     "all over"  . GERD (gastroesophageal reflux disease)   . Kidney stones     remote hx/notes 10/25/2008    Past Surgical History  Procedure Laterality Date  . Abdominal hysterectomy    . Supraventricular tachycardia ablation      [facility] in [place]   . Coronary angioplasty with stent placement  ~ 2006    [facility] in [place]   . Cardiac catheterization  03/2009    [facility] 04/23/2009  . Appendectomy    . Refractive surgery Bilateral 1990's    Social History:  reports that she has never smoked. She has never used smokeless tobacco. She reports that she drinks alcohol. She reports that she does not use illicit drugs.  Family History:  Family History  Problem Relation Age of Onset  . Diabetes Mother   . Heart attack  Father   . Heart disease Sister      Prior to Admission medications   Medication Sig Start Date End Date Taking? Authorizing Provider  aspirin EC 81 MG tablet Take 1 tablet (81 mg total) by mouth daily. 07/31/13   Lars Masson, MD  atorvastatin (LIPITOR) 20 MG tablet Take 1 tablet (20 mg total) by mouth daily. 07/31/13   Lars Masson, MD  gabapentin (NEURONTIN) 100 MG capsule Take 1 capsule (100 mg total) by mouth 3 (three) times daily.  06/27/13   Ramond Marrow, DO  loratadine (CLARITIN) 10 MG tablet  05/21/13   Historical Provider, MD  metoprolol succinate (TOPROL-XL) 25 MG 24 hr tablet Take 25 mg by mouth daily.    Historical Provider, MD  triamterene-hydrochlorothiazide (MAXZIDE-25) 37.5-25 MG per tablet Take 1 tablet by mouth daily.    Historical Provider, MD    Physical Exam: Filed Vitals:   10/27/14 1950 10/27/14 2046 10/27/14 2105 10/27/14 2228  BP: 126/80 118/53 118/58 117/47  Pulse: 90 86 84 78  Temp:  99.3 F (37.4 C)  100.1 F (37.8 C)  TempSrc:    Oral  Resp: Height:     (1.676 m)  Weight:    75.751 kg (167 lb)  SpO2: 98% 95% 94% 98%   General: Not in acute distress HEENT:       Eyes: PERRL, EOMI, no scleral icterus.       ENT: No discharge from the ears and nose, no pharynx injection, no tonsillar enlargement.        Neck: No JVD, no bruit, no mass felt. Heme: No neck lymph node enlargement. Cardiac: S1/S2, RRR, No murmurs, No gallops or rubs. Pulm: No rales, wheezing, rhonchi or rubs. Abd: Soft, nondistended, nontender, no rebound pain, no organomegaly, BS present. Ext: No pitting leg edema bilaterally. 2+DP/PT pulse bilaterally. Musculoskeletal: No joint deformities, No joint redness or warmth, no limitation of ROM in spin. Skin: No rashes.  Neuro: Alert, oriented X3, cranial nerves II-XII grossly intact, muscle strength 5/5 in all extremities, sensation to light touch intact.  Psych: Patient is not psychotic, no suicidal or hemocidal ideation.  Labs on Admission:  Basic Metabolic Panel:  Recent Labs Lab 10/27/14 1748  NA 138  K 3.4*  CL 103  CO2 25  GLUCOSE 93  BUN 22*  CREATININE 0.73  CALCIUM 9.3   Liver Function Tests:  Recent Labs Lab 10/27/14 1748  AST 25  ALT 30  ALKPHOS 62  BILITOT 0.5  PROT 8.0  ALBUMIN 4.3   No results for input(s): LIPASE, AMYLASE in the last 168 hours. No results for input(s): AMMONIA in the last 168 hours. CBC:  Recent  Labs Lab 10/27/14 1748  WBC 8.4  NEUTROABS 5.7  HGB 13.3  HCT 40.9  MCV 91.7  PLT 329   Cardiac Enzymes:  Recent Labs Lab 10/27/14 1748  TROPONINI <0.03    BNP (last 3 results) No results for input(s): BNP in the last 8760 hours.  ProBNP (last 3 results) No results for input(s): PROBNP in the last 8760 hours.  CBG: No results for input(s): GLUCAP in the last 168 hours.  Radiological Exams on Admission: Dg Chest 2 View  10/27/2014   CLINICAL DATA:  Central chest pain radiating to left arm, neck, back and jaw for 7 hours. Diaphoresis, weakness and shortness of breath.  EXAM: CHEST  2 VIEW  COMPARISON:  12/31/2010  FINDINGS: The cardiomediastinal contours are normal.  The lungs are clear. Pulmonary vasculature is normal. No consolidation, pleural effusion, or pneumothorax. No acute osseous abnormalities are seen.  IMPRESSION: No acute pulmonary process.   Electronically Signed   By: Rubye Oaks M.D.   On: 10/27/2014 18:11   Ct Angio Chest Pe W/cm &/or Wo Cm  10/27/2014   CLINICAL DATA:  Mid chest and back pain since this morning. Shortness of breath. Rule out aortic dissection or pulmonary embolism.  EXAM: CT ANGIOGRAPHY CHEST WITH CONTRAST  TECHNIQUE: Multidetector CT imaging of the chest was performed using the standard protocol during bolus administration of intravenous contrast. Multiplanar CT image reconstructions and MIPs were obtained to evaluate the vascular anatomy.  CONTRAST:  OMNIPAQUE IOHEXOL 350 MG/ML SOLN  COMPARISON:  Plain film of earlier today.  No prior CT.  FINDINGS: Mediastinum/Nodes: The quality of this exam for evaluation of pulmonary embolism is moderate. The bolus is suboptimally timed, with contrast centered in the aorta. No central pulmonary embolism. No lobar or large segmental embolism identified.  Normal appearance of the great vessels. Normal aortic caliber without dissection. There is aortic atherosclerosis. There may be an LAD coronary artery  stent. Subcentimeter right thyroid nodule is nonspecific. No mediastinal or hilar adenopathy. Small hiatal hernia  Lungs/Pleura: No pleural fluid. Minimal subsegmental atelectasis in the posterior left upper lobe.  Upper abdomen: Old granulomatous disease in the right lobe of the liver. Normal imaged portions of the spleen, pancreas, adrenal glands, kidneys.  Musculoskeletal: No acute osseous abnormality.  Review of the MIP images confirms the above findings.  IMPRESSION: 1. Suboptimal bolus timing. No large pulmonary embolism with limitations above. 2. Aortic atherosclerosis without dissection or aneurysm. 3. Probable LAD coronary artery stent versus age advanced atherosclerosis. Clinical history describes prior angioplasty in 2010. 4. Small hiatal hernia.   Electronically Signed   By: Jeronimo Greaves M.D.   On: 10/27/2014 19:06    EKG: Independently reviewed.  Abnormal findings: Nonspecific T-wave change Assessment/Plan Principal Problem:   Chest pain Active Problems:   Essential hypertension   Hyperlipidemia   Hypokalemia   Anxiety   Fever   GERD (gastroesophageal reflux disease)   Depression   Cough   CAD (coronary artery disease) of artery bypass graft   Chest pain and CAD (s/p of stent): No pneumonia by chest x-ray and CT angiogram. She does not have aortic dissection or large pulmonary embolism by CTA. She has constant chest pain, which is still going on at 8 out of 10 in severiry, concerning for ACS. Her chest is radiating to the back, concerning for dissection. I asked the On-call radiologist to have looked CTA image again. Per on-call radiologist, down to the pancreas level, there is no aortic dissection. Since pt does not have abdominal pain, it is very unlikely to have aortic dissection below the pancreas level.  - will admit to SDU due to ongoing chest pain  - cycle CE q6 x3 and repeat her EKG in the am  - Nitroglycerin gtt - start Heparin gtt - prn Morphine, metoprolol and  aspirin - increase the dose of lipitor from 20 to 80 mg daily - Risk factor stratification: will check UDS, FLP and A1C  - check lipase - 2d echo - Called card PA  in AM, will see today.    Essential hypertension: - continue Metoprolol - hold maxzide  -IV hydralazine prn  HLD: Last LDL was not on record - Lipitor 80 mg daily -Check FLP  Hypokalemia: K= 3.3 on admission. -  Repleted  Depression and anxiety: Stable, no suicidal or homicidal ideations. -Continue home medications: Citalopram  GERD: -Protonix  Fever, cough and SOB: No PNA on CXR -Empiric Z-pack for possible bronchitis -blood culture -check UA   DVT ppx: On  Heparin gtt Code Status: Full code Family Communication:  Yes, patient's husband  at bed side Disposition Plan: Admit to inpatient   Date of Service 10/28/2014    Lorretta Harp [facility] Pager [phone]  If 7PM-7AM, please contact night-coverage [email or web address] Password [email or web address] 10/28/2014, 12:01 AM

## 2014-10-27 NOTE — ED Notes (Signed)
MD at bedside. 

## 2014-10-27 NOTE — ED Notes (Signed)
Pt has family translating at bedside.

## 2014-10-27 NOTE — Progress Notes (Signed)
ANTICOAGULATION CONSULT NOTE - Initial Consult  Pharmacy Consult for heparin Indication: chest pain/ACS  No Known Allergies  Patient Measurements: Height:  (167.6 cm) Weight: 167 lb (75.751 kg) IBW/kg (Calculated) : 59.3 Heparin Dosing Weight: 75kg  Vital Signs: Temp: 100.1 F (37.8 C) (09/26 2228) Temp Source: Oral (09/26 2228) BP: 117/47 mmHg (09/26 2228) Pulse Rate: 78 (09/26 2228)  Labs:  Recent Labs  10/27/14 1748  HGB 13.3  HCT 40.9  PLT 329  CREATININE 0.73  TROPONINI <0.03    Estimated Creatinine Clearance: 83.6 mL/min (by C-G formula based on Cr of 0.73).   Medical History: Past Medical History  Diagnosis Date  . Hypertension   . Coronary artery disease   . Family history of adverse reaction to anesthesia     "sister almost stopped breathing"   . Hypercholesterolemia   . Pneumonia ~ 2014  . OSA on CPAP   . Arthritis     "all over"    Medications:  Prescriptions prior to admission  Medication Sig Dispense Refill Last Dose  . aspirin EC 81 MG tablet Take 1 tablet (81 mg total) by mouth daily. 90 tablet 3   . atorvastatin (LIPITOR) 20 MG tablet Take 1 tablet (20 mg total) by mouth daily. 90 tablet 3   . gabapentin (NEURONTIN) 100 MG capsule Take 1 capsule (100 mg total) by mouth 3 (three) times daily. 90 capsule 6 Taking  . loratadine (CLARITIN) 10 MG tablet    Taking  . metoprolol succinate (TOPROL-XL) 25 MG 24 hr tablet Take 25 mg by mouth daily.   Taking  . triamterene-hydrochlorothiazide (MAXZIDE-25) 37.5-25 MG per tablet Take 1 tablet by mouth daily.   Taking   Scheduled:  . [START ON 10/28/2014] aspirin EC  81 mg Oral Daily  . atorvastatin  80 mg Oral Daily  . dextromethorphan-guaiFENesin  1 tablet Oral BID  . gabapentin  100 mg Oral TID  . [START ON 10/28/2014] Influenza vac split quadrivalent PF  0.5 mL Intramuscular Tomorrow-1000  . metoprolol succinate  25 mg Oral Daily  .  morphine injection  2 mg Intravenous Once  . potassium  chloride  20 mEq Oral Once   Infusions:  . nitroGLYCERIN      Assessment: 54yo female c/o CP radiating to LUE, back, and jaw, reports some diaphoresis, weakness, and SOB, initial troponin negative, to begin heparin.  Goal of Therapy:  Heparin level 0.3-0.7 units/ml Monitor platelets by anticoagulation protocol: Yes   Plan:  Will give heparin 4000 units IV bolus x1 followed by gtt at 1000 units/hr and monitor heparin levels and CBC.  Vernard Gambles, PharmD, BCPS  10/27/2014,11:23 PM

## 2014-10-27 NOTE — ED Notes (Signed)
Pt returned from X-ray.  

## 2014-10-27 NOTE — ED Provider Notes (Signed)
CSN: [id number]   Arrival date & time 10/27/14 1717  History  By signing my name below, I, Bethel Born, attest that this documentation has been prepared under the direction and in the presence of Courteney Randall An, MD. Electronically Signed: Bethel Born, ED Scribe. 10/27/2014. 11:21 PM.  Chief Complaint  Patient presents with  . Chest Pain    HPI The history is provided by the patient. No language interpreter was used.   Kiara Sanchez is a 54 y.o. female with PMHx of HTN, CAD s/p angioplasty in 2010 (performed in [place]), DVT diagnosed 2 months ago, and anxiety who presents to the Emergency Department complaining of constant left-sided chest pain with onset 2 days ago.The pain worsened around 11 AM today. The pain is sharp and radiates to the back and left jaw. Associated symptoms include fatigue and cough today. Pt denies fever. Pt had both a normal stress test and normal Holter monitor exam in 2015. She takes aspirin daily.   Pt is Spanish-speaking.   Past Medical History  Diagnosis Date  . Hypertension   . Coronary artery disease   . Family history of adverse reaction to anesthesia     "sister almost stopped breathing"   . Hypercholesterolemia   . Pneumonia ~ 2014  . OSA on CPAP   . Arthritis     "all over"    Past Surgical History  Procedure Laterality Date  . Abdominal hysterectomy    . Supraventricular tachycardia ablation      [facility] in [place]   . Coronary angioplasty with stent placement  ~ 2006    [facility] in [place]   . Cardiac catheterization  03/2009    [facility] 04/23/2009  . Appendectomy    . Refractive surgery Bilateral 1990's    History reviewed. No pertinent family history.  Social History  Substance Use Topics  . Smoking status: Never Smoker   . Smokeless tobacco: Never Used  . Alcohol Use: Yes     Comment: 10/27/2014 "glass of wine q couple months"     Review of Systems  Constitutional: Positive for  fatigue. Negative for fever and chills.  Respiratory: Positive for cough.   Cardiovascular: Positive for chest pain.  Gastrointestinal: Negative for nausea and vomiting.  Neurological: Negative for weakness.  All other systems reviewed and are negative.  Home Medications   Prior to Admission medications   Medication Sig Start Date End Date Taking? Authorizing Provider  aspirin EC 81 MG tablet Take 1 tablet (81 mg total) by mouth daily. 07/31/13   Lars Masson, MD  atorvastatin (LIPITOR) 20 MG tablet Take 1 tablet (20 mg total) by mouth daily. 07/31/13   Lars Masson, MD  gabapentin (NEURONTIN) 100 MG capsule Take 1 capsule (100 mg total) by mouth 3 (three) times daily. 06/27/13   Ramond Marrow, DO  loratadine (CLARITIN) 10 MG tablet  05/21/13   Historical Provider, MD  metoprolol succinate (TOPROL-XL) 25 MG 24 hr tablet Take 25 mg by mouth daily.    Historical Provider, MD  triamterene-hydrochlorothiazide (MAXZIDE-25) 37.5-25 MG per tablet Take 1 tablet by mouth daily.    Historical Provider, MD    Allergies  Review of patient's allergies indicates no known allergies.  Triage Vitals: BP 113/96 mmHg  Pulse 82  Temp(Src) 100 F (37.8 C) (Oral)  Resp 22  SpO2 100%  LMP 12/24/2001  Physical Exam  Constitutional: She is oriented to person, place, and time. She appears well-developed and well-nourished. No distress.  HENT:  Head: Normocephalic and atraumatic.  Mouth/Throat: No posterior oropharyngeal erythema.  Eyes: EOM are normal.  Neck: Normal range of motion.  Cardiovascular: Normal rate, regular rhythm and normal heart sounds.  Exam reveals no gallop and no friction rub.   No murmur heard. Pulmonary/Chest: Effort normal and breath sounds normal.  Equal breath sounds bilaterally  Abdominal: Soft. She exhibits no distension. There is no tenderness.  Musculoskeletal: Normal range of motion.  Lymphadenopathy:    She has no cervical adenopathy.  Neurological: She is alert  and oriented to person, place, and time.  Skin: Skin is warm and dry.  Psychiatric: She has a normal mood and affect. Judgment normal.  Nursing note and vitals reviewed.   ED Course  Procedures   DIAGNOSTIC STUDIES: Oxygen Saturation is 100% on RA, normal by my interpretation.    COORDINATION OF CARE: 6:06 PM Discussed treatment plan which includes CXR, EKG, labs, and CT chest with pt at bedside and pt agreed to plan.  Labs Reviewed  COMPREHENSIVE METABOLIC PANEL - Abnormal; Notable for the following:    Potassium 3.4 (*)    BUN 22 (*)    All other components within normal limits  MRSA PCR SCREENING  CBC WITH DIFFERENTIAL/PLATELET  TROPONIN I    Imaging Review Dg Chest 2 View  10/27/2014   CLINICAL DATA:  Central chest pain radiating to left arm, neck, back and jaw for 7 hours. Diaphoresis, weakness and shortness of breath.  EXAM: CHEST  2 VIEW  COMPARISON:  12/31/2010  FINDINGS: The cardiomediastinal contours are normal. The lungs are clear. Pulmonary vasculature is normal. No consolidation, pleural effusion, or pneumothorax. No acute osseous abnormalities are seen.  IMPRESSION: No acute pulmonary process.   Electronically Signed   By: Rubye Oaks M.D.   On: 10/27/2014 18:11   Ct Angio Chest Pe W/cm &/or Wo Cm  10/27/2014   CLINICAL DATA:  Mid chest and back pain since this morning. Shortness of breath. Rule out aortic dissection or pulmonary embolism.  EXAM: CT ANGIOGRAPHY CHEST WITH CONTRAST  TECHNIQUE: Multidetector CT imaging of the chest was performed using the standard protocol during bolus administration of intravenous contrast. Multiplanar CT image reconstructions and MIPs were obtained to evaluate the vascular anatomy.  CONTRAST:  OMNIPAQUE IOHEXOL 350 MG/ML SOLN  COMPARISON:  Plain film of earlier today.  No prior CT.  FINDINGS: Mediastinum/Nodes: The quality of this exam for evaluation of pulmonary embolism is moderate. The bolus is suboptimally timed, with  contrast centered in the aorta. No central pulmonary embolism. No lobar or large segmental embolism identified.  Normal appearance of the great vessels. Normal aortic caliber without dissection. There is aortic atherosclerosis. There may be an LAD coronary artery stent. Subcentimeter right thyroid nodule is nonspecific. No mediastinal or hilar adenopathy. Small hiatal hernia  Lungs/Pleura: No pleural fluid. Minimal subsegmental atelectasis in the posterior left upper lobe.  Upper abdomen: Old granulomatous disease in the right lobe of the liver. Normal imaged portions of the spleen, pancreas, adrenal glands, kidneys.  Musculoskeletal: No acute osseous abnormality.  Review of the MIP images confirms the above findings.  IMPRESSION: 1. Suboptimal bolus timing. No large pulmonary embolism with limitations above. 2. Aortic atherosclerosis without dissection or aneurysm. 3. Probable LAD coronary artery stent versus age advanced atherosclerosis. Clinical history describes prior angioplasty in 2010. 4. Small hiatal hernia.   Electronically Signed   By: Jeronimo Greaves M.D.   On: 10/27/2014 19:06     EKG Interpretation  Date/Time:    Ventricular Rate:    PR Interval:    QRS Duration:   QT Interval:    QTC Calculation:   R Axis:     Text Interpretation:        Patient's ekg is read in muse. 88 NSR No acute sichemia. Similar to previous.   MDM   Final diagnoses:  Chest pain, unspecified chest pain type   Patient is a 54 year old female presenting today with chest pain ongoing for couple days worse 11:00 this morning. Had angioplasty done 10 years ago, normal stress test 1 year ago, normal Holter monitor 1 year ago. Normally EF 1 year ago. Patient had DVT diagnosed 2 months ago and has been on aspirin since. Patient has pain radiating to her back and shortness of breath with cough. Concern for dissection versus pulmonary embolism. We'll get CT study to better characterize.  Patient will likely need  admission given her risk factors and ongoing chest pain.  I personally performed the services described in this documentation, which was scribed in my presence. The recorded information has been reviewed and is accurate.     Courteney Randall An, MD 10/27/14 2322

## 2014-10-27 NOTE — ED Notes (Signed)
Pt transported to XRay 

## 2014-10-28 ENCOUNTER — Inpatient Hospital Stay (HOSPITAL_COMMUNITY): Payer: 59

## 2014-10-28 DIAGNOSIS — I2510 Atherosclerotic heart disease of native coronary artery without angina pectoris: Secondary | ICD-10-CM

## 2014-10-28 DIAGNOSIS — R079 Chest pain, unspecified: Secondary | ICD-10-CM

## 2014-10-28 DIAGNOSIS — R072 Precordial pain: Secondary | ICD-10-CM

## 2014-10-28 LAB — RAPID URINE DRUG SCREEN, HOSP PERFORMED
AMPHETAMINES: NOT DETECTED
BARBITURATES: NOT DETECTED
Benzodiazepines: NOT DETECTED
Cocaine: NOT DETECTED
Opiates: POSITIVE — AB
Tetrahydrocannabinol: NOT DETECTED

## 2014-10-28 LAB — CBC
HCT: 39 % (ref 36.0–46.0)
HEMOGLOBIN: 12.6 g/dL (ref 12.0–15.0)
MCH: 30.4 pg (ref 26.0–34.0)
MCHC: 32.3 g/dL (ref 30.0–36.0)
MCV: 94 fL (ref 78.0–100.0)
PLATELETS: 253 10*3/uL (ref 150–400)
RBC: 4.15 MIL/uL (ref 3.87–5.11)
RDW: 13.9 % (ref 11.5–15.5)
WBC: 6 10*3/uL (ref 4.0–10.5)

## 2014-10-28 LAB — BASIC METABOLIC PANEL
ANION GAP: 7 (ref 5–15)
BUN: 12 mg/dL (ref 6–20)
CHLORIDE: 105 mmol/L (ref 101–111)
CO2: 24 mmol/L (ref 22–32)
Calcium: 8.6 mg/dL — ABNORMAL LOW (ref 8.9–10.3)
Creatinine, Ser: 0.7 mg/dL (ref 0.44–1.00)
GFR calc Af Amer: >60 mL/min (ref >60)
GLUCOSE: 89 mg/dL (ref 65–99)
POTASSIUM: 3.9 mmol/L (ref 3.5–5.1)
Sodium: 136 mmol/L (ref 135–145)

## 2014-10-28 LAB — URINALYSIS, ROUTINE W REFLEX MICROSCOPIC
BILIRUBIN URINE: NEGATIVE
Glucose, UA: NEGATIVE mg/dL
KETONES UR: 15 mg/dL — AB
Leukocytes, UA: NEGATIVE
NITRITE: NEGATIVE
Protein, ur: NEGATIVE mg/dL
SPECIFIC GRAVITY, URINE: 1.02 (ref 1.005–1.030)
UROBILINOGEN UA: 0.2 mg/dL (ref 0.0–1.0)
pH: 5.5 (ref 5.0–8.0)

## 2014-10-28 LAB — GLUCOSE, CAPILLARY: Glucose-Capillary: 90 mg/dL (ref 65–99)

## 2014-10-28 LAB — LIPID PANEL
CHOLESTEROL: 177 mg/dL (ref 0–200)
HDL: 36 mg/dL — ABNORMAL LOW (ref >40)
LDL Cholesterol: 110 mg/dL — ABNORMAL HIGH (ref 0–99)
TRIGLYCERIDES: 154 mg/dL — AB (ref <150)
Total CHOL/HDL Ratio: 4.9 RATIO
VLDL: 31 mg/dL (ref 0–40)

## 2014-10-28 LAB — MRSA PCR SCREENING: MRSA BY PCR: NEGATIVE

## 2014-10-28 LAB — URINE MICROSCOPIC-ADD ON

## 2014-10-28 LAB — APTT

## 2014-10-28 LAB — TROPONIN I

## 2014-10-28 LAB — HIV ANTIBODY (ROUTINE TESTING W REFLEX): HIV SCREEN 4TH GENERATION: NONREACTIVE

## 2014-10-28 LAB — LIPASE, BLOOD: LIPASE: 27 U/L (ref 22–51)

## 2014-10-28 LAB — PROTIME-INR
INR: 1.12 (ref 0.00–1.49)
Prothrombin Time: 14.6 seconds (ref 11.6–15.2)

## 2014-10-28 LAB — HEPARIN LEVEL (UNFRACTIONATED): Heparin Unfractionated: 0.39 IU/mL (ref 0.30–0.70)

## 2014-10-28 MED ORDER — PANTOPRAZOLE SODIUM 20 MG PO TBEC: 20.0000 mg | DELAYED_RELEASE_TABLET | Freq: Every day | ORAL | Status: DC

## 2014-10-28 MED ORDER — PANTOPRAZOLE SODIUM 40 MG PO TBEC: 40.0000 mg | DELAYED_RELEASE_TABLET | Freq: Every day | ORAL | Status: DC

## 2014-10-28 MED ORDER — AZITHROMYCIN 250 MG PO TABS: 250.0000 mg | ORAL_TABLET | Freq: Every day | ORAL | Status: DC

## 2014-10-28 MED ORDER — ATORVASTATIN CALCIUM 80 MG PO TABS: 80.0000 mg | ORAL_TABLET | Freq: Every day | ORAL | Status: DC

## 2014-10-28 NOTE — Progress Notes (Signed)
ANTICOAGULATION CONSULT NOTE - Follow-up Consult  Pharmacy Consult for heparin Indication: chest pain/ACS  No Known Allergies  Patient Measurements: Height:  (167.6 cm) Weight: 166 lb 14.2 oz (75.7 kg) IBW/kg (Calculated) : 59.3 Heparin Dosing Weight: 75kg  Vital Signs: Temp: 98.4 F (36.9 C) (09/27 0400) Temp Source: Oral (09/27 0400) BP: 106/55 mmHg (09/27 0407) Pulse Rate: 68 (09/27 0159)  Labs:  Recent Labs  10/27/14 1748 10/28/14 0029 10/28/14 0535  HGB 13.3  --   --   HCT 40.9  --   --   PLT 329  --   --   APTT  --  >200*  --   LABPROT  --  14.6  --   INR  --  1.12  --   HEPARINUNFRC  --   --  0.39  CREATININE 0.73  --   --   TROPONINI <0.03 <0.03  --     Estimated Creatinine Clearance: 83.6 mL/min (by C-G formula based on Cr of 0.73).  Assessment: 54yo female on heparin for r/o ACS. Heparin level therapeutic. No bleeding noted.   Goal of Therapy:  Heparin level 0.3-0.7 units/ml Monitor platelets by anticoagulation protocol: Yes   Plan:  Continue heparin at 1000 units/hr Will f/u 6hr confirmatory heparin level  Christoper Fabian, PharmD, BCPS Clinical pharmacist, pager [phone] 10/28/2014,6:32 AM

## 2014-10-28 NOTE — Discharge Summary (Addendum)
Physician Discharge Summary  Kiara Sanchez ZOX:[id number] DOB: 06-12-1960 DOA: 10/27/2014  PCP: Gus Rankin, PA-C  Admit date: 10/27/2014 Discharge date: 10/28/2014  Time spent: 35 minutes  Recommendations for Outpatient Follow-up:  1. 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 female with PMH of hypertension, hyperlipidemia, OSA, GERD, depression, anxiety, CAD (s/p of stent), who presents with chest pain, cough and fever.  Pt does not speak [place]. She is accompanied by her husband who speaks good [place]. History was obtained through his husband's translation. She started having chest pain 2 days ago. Her chest is located substernal area, constant, 8 of 10 in severity, radiating to back, neck and left jaw. It is associated with mild nausea, but no vomiting, abdominal pain or diarrhea. She has dry cough, mild shortness stress and fever. She does not have dysuria or burning on urination, but has increased urinary frequency which she attributes to the use of Maxzide. She states that she has "clot" in the back of left knee. She report that she had "Scan" in a walking clinic on [address] (not remember the name) due to left leg pain behind her knee at 1.5 months ago. She was started with ASA. Currently no leg pain. Patient does not have rashes, unilateral weakness.  In ED, patient was found to have negative troponin, WBC 8.4, temperature 100.1, tachycardia, potassium 3.4, negative chest x-ray. CT angiogram of the  chest was suboptimal study, but did showed aortic dissection or large pulmonary embolism, showed a amall hiatal hernia. Patient is admitted for further evaluation treatment.   Hospital Course:  atypical symptoms in the setting of a fever and cough-- on azithromycin-- will treat as bronchitis -previously undergone coronary angiography that did not reveal any significant coronary lesions.  -stress test last year that was negative for ischemia - 3 sets of negative cardiac enzymes -outpatient for stress testing  -resume home meds -increase lipitor  Procedures:  echo  Consultations:  cards  Discharge Exam: Filed Vitals:   10/28/14 0725  BP:   Pulse:   Temp: 98.5 F (36.9 C)  Resp:     General: awake, NAD   Discharge Instructions   Discharge Instructions    Diet - low sodium heart healthy    Complete by:  As directed      Discharge instructions    Complete by:  As directed   Outpatient stress test     Increase activity slowly    Complete by:  As directed           Current Discharge Medication List    START taking these medications   Details  azithromycin (ZITHROMAX) 250 MG tablet Take 1 tablet (250 mg total) by mouth daily. Qty: 4 each, Refills: 0    pantoprazole (PROTONIX) 20 MG tablet Take 1 tablet (20 mg total) by mouth daily.      CONTINUE these medications which have CHANGED   Details  atorvastatin (LIPITOR) 80 MG tablet Take 1 tablet (80 mg  total) by mouth daily at 6 PM. Qty: 30 tablet, Refills: 0      CONTINUE these medications which have NOT CHANGED   Details  metoprolol succinate (TOPROL-XL) 25 MG 24 hr tablet Take 25 mg by mouth daily.    aspirin EC 81 MG tablet Take 1 tablet (81 mg total) by mouth daily. Qty: 90 tablet, Refills: 3    loratadine (CLARITIN) 10 MG tablet       STOP taking these medications     triamterene-hydrochlorothiazide (MAXZIDE-25) 37.5-25 MG per tablet      gabapentin (NEURONTIN) 100 MG capsule        No Known  Allergies Follow-up Information    Follow up with Melvin, Casey N, PA-C In 1 week.   Specialty:  Pulmonary Disease   Contact information:   [address] [place] [address] [phone]       Follow up with NELSON, KATARINA H, MD On 11/10/2014.   Specialty:  Cardiology   Why:  outpatient stress test Appointment on Oct 10 at 9:30am   Contact information:   [address] [place] [address] [phone]        The results of significant diagnostics from this hospitalization (including imaging, microbiology, ancillary and laboratory) are listed below for reference.    Significant Diagnostic Studies: Dg Chest 2 View  10/27/2014   CLINICAL DATA:  Central chest pain radiating to left arm, neck, back and jaw for 7 hours. Diaphoresis, weakness and shortness of breath.  EXAM: CHEST  2 VIEW  COMPARISON:  12/31/2010  FINDINGS: The cardiomediastinal contours are normal. The lungs are clear. Pulmonary vasculature is normal. No consolidation, pleural effusion, or pneumothorax. No acute osseous abnormalities are seen.  IMPRESSION: No acute pulmonary process.   Electronically Signed   By: Melanie  Ehinger M.D.   On: 10/27/2014 18:11   Ct Angio Chest Pe W/cm &/or Wo Cm  10/27/2014   CLINICAL DATA:  Mid chest and back pain since this morning. Shortness of breath. Rule out aortic dissection or pulmonary embolism.  EXAM: CT ANGIOGRAPHY CHEST WITH CONTRAST  TECHNIQUE: Multidetector CT imaging of the chest was performed using the standard protocol during bolus administration of intravenous contrast. Multiplanar CT image reconstructions and MIPs were obtained to evaluate the vascular anatomy.  CONTRAST:  <MEASUREMManson PassJordan [id number] Pin6 -1[place] IManson Passey6Hun5621San971 High PoGri(4<MEASUManson Passey6Church [id number] YGri9 -0John C. LincolManson [id number] MarlborouGri(6 )4[facility] Manson Passey6Forest [id number] Smoky HolloGri6<MEASUREMEManson Passey6Fit5621San99992 S. AndoveGri(6 )0[facility] Lo[facility]anson Passey6R5621San998 EdgemoGri5 -7Manson Passey6Be5621San97350 ThatchGri(3 )3[facility] Manson Passey6La 5621San940 South FulGri3 -7Manson Passey6Ma5621San97768 WestminsterGri(27 Manson Passey6Encin5621San95 South BrickyGri2 -1Advanced ReManson Passey6J5621San954 ClinGri(8 )2[facility] Manson Passey6T5621San9623 HomestGri8 -2Orthoatlanta SurgManson Passey6Summer5621San910 PrincetoGr 42Endsocopy CenManson Passey6Port [id number] PlymouthGri2 -3NorthManson Passey6Matag5621San9560 [id number] Center LP0 MG/ML SOLN  COMPARISON:  Plain film of earlier today.  No prior CT.  FINDINGS: Mediastinum/Nodes: The quality of this exam for evaluation of pulmonary embolism is moderate. The bolus is suboptimally timed, with contrast centered in the aorta. No central  pulmonary embolism. No lobar or large segmental embolism identified.  Normal appearance of the great vessels. Normal aortic caliber without dissection. There is aortic atherosclerosis. There may be an LAD coronary artery stent. Subcentimeter right thyroid nodule is nonspecific. No mediastinal or hilar adenopathy. Small hiatal hernia  Lungs/Pleura: No pleural fluid. Minimal subsegmental atelectasis in the posterior left upper lobe.  Upper abdomen: Old granulomatous disease in the right lobe of the liver. Normal imaged portions of the spleen, pancreas, adrenal glands, kidneys.  Musculoskeletal: No acute osseous abnormality.  Review of the MIP images confirms the above findings.  IMPRESSION: 1. Suboptimal bolus timing. No large pulmonary embolism with limitations above. 2. Aortic atherosclerosis without dissection or aneurysm. 3. Probable LAD coronary artery stent versus age advanced atherosclerosis. Clinical history describes prior angioplasty in 2010. 4. Small hiatal hernia.   Electronically Signed   By: Kyle  Talbot M.D.  On: 10/27/2014 19:06    Microbiology: Recent Results (from the past 240 hour(s))  MRSA PCR Screening     Status: None   Collection Time: 10/27/14 10:38 PM  Result Value Ref Range Status   MRSA by PCR NEGATIVE NEGATIVE Final    Comment:        The GeneXpert MRSA Assay (FDA approved for NASAL specimens only), is one component of a comprehensive MRSA colonization surveillance program. It is not intended to diagnose MRSA infection nor to guide or monitor treatment for MRSA infections.      Labs: Basic Metabolic Panel:  Recent Labs Lab 10/27/14 1748 10/28/14 0535  NA 138 136  K 3.4* 3.9  CL 103 105  CO2 25 24  GLUCOSE 93 89  BUN 22* 12  CREATININE 0.73 0.70  CALCIUM 9.3 8.6*   Liver Function Tests:  Recent Labs Lab 10/27/14 1748  AST 25  ALT 30  ALKPHOS 62  BILITOT 0.5  PROT 8.0  ALBUMIN 4.3    Recent Labs Lab 10/28/14 0029  LIPASE 27   No results  for input(s): AMMONIA in the last 168 hours. CBC:  Recent Labs Lab 10/27/14 1748 10/28/14 0535  WBC 8.4 6.0  NEUTROABS 5.7  --   HGB 13.3 12.6  HCT 40.9 39.0  MCV 91.7 94.0  PLT 329 253   Cardiac Enzymes:  Recent Labs Lab 10/27/14 1748 10/28/14 0029 10/28/14 0535  TROPONINI <0.03 <0.03 <0.03   BNP: BNP (last 3 results) No results for input(s): BNP in the last 8760 hours.  ProBNP (last 3 results) No results for input(s): PROBNP in the last 8760 hours.  CBG:  Recent Labs Lab 10/28/14 0727  GLUCAP 90       Signed:  VANN, JESSICA  [facility] 10/28/2014, 11:40 AM

## 2014-10-28 NOTE — Progress Notes (Signed)
Critical Lab Value PTT >200. Pharmacist aware. Will continue to monitor.

## 2014-10-28 NOTE — Consult Note (Signed)
Patient ID: Shady Padron MRN: [id number] DOB/AGE: [birth year] 54 y.o.  Admit date: 10/27/2014 Primary Physician Gus Rankin, PA-C  Primary Cardiologist Tobias Alexander, MD   Chief Complaint  Chest pain, cough, fever  HPI: Ms. Beazer is a 54F with hypertension, hyperlipidemia, OSA, GERD and CAD s/p PCI here with chest pain, cough and fever.  Ms. Suhre reports 3 days of strong chest pain that got worse yesterday around 11 AM. The symptoms were worse with exertion and associated with shortness of breath. She also endorses diaphoresis. She denied lightheadedness, nausea or vomiting. Her symptoms were worse when walking upstairs. The pain was constant all day.  She reports having a stent placed 10-12 years ago and says that she felt similar to that episode.  She endorses a nonproductive cough that has been going on for the last several days. She denies sick contacts.  In the ED Ms. Weldin was mildly febrile to 100.1.  CT-A for PE was suboptimal due to timing of the contrast bolus, but did not reveal any central, lobar or large segmental embolism.  It demonstrated aortic atherosclerosis and an LAD stent vs atherosclerosis as well as a small hiatal hernia.  She was admitted to the hospitalist service for further management.  Overnight she had 3 sets of negative cardiac enzymes and denies any further chest pain.  Ms. Alatorre underwent nuclear stress testing on 08/08/13 that was negative for ischemia.  In 03/2009 she underwent LHC that demonstrated widely patent vessels and no stent was identified.  Review of Systems:     Cardiac Review of Systems: [ x] = yes  = no  Chest Pain [  x  ]  Resting SOB [   ] Exertional SOB  [ x ]  Orthopnea [  ]   Pedal Edema [   ]    Palpitations [  ] Syncope  [  ]   Presyncope [   ]  General Review of Systems: [Y] = yes [  ]=no Constitional: recent weight change [  ]; anorexia [x  ]; fatigue [  ]; nausea [  ]; night sweats [  ]; fever [ x ]; or chills [  ];                                                                       Eyes : blurred vision [  ]; diplopia [   ]; vision changes [  ];  Amaurosis fugax[  ]; Resp: cough [x  ];  wheezing[  ];  hemoptysis[  ];  PND [  ];  GI:  gallstones[  ], vomiting[  ];  dysphagia[  ]; melena[  ];  hematochezia [  ]; heartburn[  ];   GU: kidney stones [  ]; hematuria[  ];   dysuria [  ];  nocturia[  ]; incontinence [  ];             Skin: rash, swelling[  ];, hair loss[  ];  peripheral edema[  ];  or itching[  ]; Musculosketetal: myalgias[  ];  joint swelling[  ];  joint erythema[  ];  joint pain[  ];  back pain[  ];  Heme/Lymph: bruising[  ];  bleeding[  ];  anemia[  ];  Neuro: TIA[  ];  headaches[  ];  stroke[  ];  vertigo[  ];  seizures[  ];   paresthesias[  ];  difficulty walking[  ];  Psych:depression[  ]; anxiety[  ];  Endocrine: diabetes[  ];  thyroid dysfunction[  ];  Other:  Past Medical History  Diagnosis Date  . Hypertension   . Coronary artery disease   . Family history of adverse reaction to anesthesia     "sister almost stopped breathing"   . Hypercholesterolemia   . Pneumonia ~ 2014  . OSA on CPAP   . Arthritis     "all over"  . GERD (gastroesophageal reflux disease)   . Kidney stones     remote hx/notes 10/25/2008    Medications Prior to Admission  Medication Sig Dispense Refill  . aspirin EC 81 MG tablet Take 1 tablet (81 mg total) by mouth daily. 90 tablet 3  . atorvastatin (LIPITOR) 20 MG tablet Take 1 tablet (20 mg total) by mouth daily. 90 tablet 3  . gabapentin (NEURONTIN) 100 MG capsule Take 1 capsule (100 mg total) by mouth 3 (three) times daily. 90 capsule 6  . loratadine (CLARITIN) 10 MG tablet     . metoprolol succinate (TOPROL-XL) 25 MG 24 hr tablet Take 25 mg by mouth daily.    Marland Kitchen triamterene-hydrochlorothiazide (MAXZIDE-25) 37.5-25 MG per tablet Take 1 tablet by mouth daily.       Marland Kitchen aspirin EC  81 mg Oral Daily  . atorvastatin  80 mg Oral q1800  . azithromycin  500 mg Oral  Daily   Followed by  . [START ON 10/29/2014] azithromycin  250 mg Oral Daily  . citalopram  40 mg Oral Daily  . dextromethorphan-guaiFENesin  1 tablet Oral BID  . gabapentin  100 mg Oral TID  . Influenza vac split quadrivalent PF  0.5 mL Intramuscular Tomorrow-1000  . metoprolol succinate  25 mg Oral Daily  . pantoprazole (PROTONIX) IV  40 mg Intravenous QHS  . sodium chloride  3 mL Intravenous Q12H    Infusions: . sodium chloride 75 mL/hr at 10/27/14 2330  . heparin 1,000 Units/hr (10/27/14 2357)  . nitroGLYCERIN 3 mcg/min (10/28/14 0019)    No Known Allergies  Social History   Social History  . Marital Status: Married    Spouse Name: N/A  . Number of Children: N/A  . Years of Education: N/A   Occupational History  . Not on file.   Social History Main Topics  . Smoking status: Never Smoker   . Smokeless tobacco: Never Used  . Alcohol Use: Yes     Comment: 10/27/2014 "glass of wine q couple months"  . Drug Use: No  . Sexual Activity: Yes    Birth Control/ Protection: Surgical     Comment: unable to ask in traige d/t son with pt and husband   Other Topics Concern  . Not on file   Social History Narrative   Married with 1 child   Right handed   12 th grade   1 cup daily    Family History  Problem Relation Age of Onset  . Diabetes Mother   . Heart attack Father   . Heart disease Sister     PHYSICAL EXAM: Filed Vitals:   10/28/14 0725  BP:   Pulse:   Temp: 98.5 F (36.9 C)  Resp:     No intake or output data in the 24 hours ending 10/28/14 0903  General:  Well appearing. No respiratory difficulty HEENT: normal Neck: supple. no JVD. Carotids 2+ bilat; no bruits. No lymphadenopathy or thryomegaly appreciated. Cor: PMI nondisplaced. Regular rate & rhythm. No rubs, gallops or murmurs.  Coarse breath sounds Lungs: clear Abdomen: soft, nontender, nondistended. No hepatosplenomegaly. No bruits or masses. Good bowel sounds. Extremities: no cyanosis,  clubbing, rash, edema Neuro: alert & oriented x 3, cranial nerves grossly intact. moves all 4 extremities w/o difficulty. Affect pleasant.  Results for orders placed or performed during the hospital encounter of 10/27/14 (from the past 24 hour(s))  CBC with Differential     Status: None   Collection Time: 10/27/14  5:48 PM  Result Value Ref Range   WBC 8.4 4.0 - 10.5 K/uL   RBC 4.46 3.87 - 5.11 MIL/uL   Hemoglobin 13.3 12.0 - 15.0 g/dL   HCT 21.3 08.6 - 57.8 %   MCV 91.7 78.0 - 100.0 fL   MCH 29.8 26.0 - 34.0 pg   MCHC 32.5 30.0 - 36.0 g/dL   RDW 46.9 62.9 - 52.8 %   Platelets 329 150 - 400 K/uL   Neutrophils Relative % 68 %   Neutro Abs 5.7 1.7 - 7.7 K/uL   Lymphocytes Relative 19 %   Lymphs Abs 1.6 0.7 - 4.0 K/uL   Monocytes Relative 11 %   Monocytes Absolute 0.9 0.1 - 1.0 K/uL   Eosinophils Relative 1 %   Eosinophils Absolute 0.1 0.0 - 0.7 K/uL   Basophils Relative 1 %   Basophils Absolute 0.1 0.0 - 0.1 K/uL  Comprehensive metabolic panel     Status: Abnormal   Collection Time: 10/27/14  5:48 PM  Result Value Ref Range   Sodium 138 135 - 145 mmol/L   Potassium 3.4 (L) 3.5 - 5.1 mmol/L   Chloride 103 101 - 111 mmol/L   CO2 25 22 - 32 mmol/L   Glucose, Bld 93 65 - 99 mg/dL   BUN 22 (H) 6 - 20 mg/dL   Creatinine, Ser 4.13 0.44 - 1.00 mg/dL   Calcium 9.3 8.9 - 24.4 mg/dL   Total Protein 8.0 6.5 - 8.1 g/dL   Albumin 4.3 3.5 - 5.0 g/dL   AST 25 15 - 41 U/L   ALT 30 14 - 54 U/L   Alkaline Phosphatase 62 38 - 126 U/L   Total Bilirubin 0.5 0.3 - 1.2 mg/dL   GFR calc non Af Amer >60 >60 mL/min   GFR calc Af Amer >60 >60 mL/min   Anion gap 10 5 - 15  Troponin I     Status: None   Collection Time: 10/27/14  5:48 PM  Result Value Ref Range   Troponin I <0.03 <0.031 ng/mL  MRSA PCR Screening     Status: None   Collection Time: 10/27/14 10:38 PM  Result Value Ref Range   MRSA by PCR NEGATIVE NEGATIVE  Troponin I (q 6hr x 3)     Status: None   Collection Time: 10/28/14 12:29  AM  Result Value Ref Range   Troponin I <0.03 <0.031 ng/mL  Lipase, blood     Status: None   Collection Time: 10/28/14 12:29 AM  Result Value Ref Range   Lipase 27 22 - 51 U/L  Protime-INR     Status: None   Collection Time: 10/28/14 12:29 AM  Result Value Ref Range   Prothrombin Time 14.6 11.6 - 15.2 seconds   INR 1.12 0.00 - 1.49  APTT     Status: Abnormal  Collection Time: 10/28/14 12:29 AM  Result Value Ref Range   aPTT >200 (HH) 24 - 37 seconds  Troponin I (q 6hr x 3)     Status: None   Collection Time: 10/28/14  5:35 AM  Result Value Ref Range   Troponin I <0.03 <0.031 ng/mL  Lipid panel     Status: Abnormal   Collection Time: 10/28/14  5:35 AM  Result Value Ref Range   Cholesterol 177 0 - 200 mg/dL   Triglycerides 161 (H) <150 mg/dL   HDL 36 (L) >09 mg/dL   Total CHOL/HDL Ratio 4.9 RATIO   VLDL 31 0 - 40 mg/dL   LDL Cholesterol 604 (H) 0 - 99 mg/dL  Heparin level (unfractionated)     Status: None   Collection Time: 10/28/14  5:35 AM  Result Value Ref Range   Heparin Unfractionated 0.39 0.30 - 0.70 IU/mL  CBC     Status: None   Collection Time: 10/28/14  5:35 AM  Result Value Ref Range   WBC 6.0 4.0 - 10.5 K/uL   RBC 4.15 3.87 - 5.11 MIL/uL   Hemoglobin 12.6 12.0 - 15.0 g/dL   HCT 54.0 98.1 - 19.1 %   MCV 94.0 78.0 - 100.0 fL   MCH 30.4 26.0 - 34.0 pg   MCHC 32.3 30.0 - 36.0 g/dL   RDW 47.8 29.5 - 62.1 %   Platelets 253 150 - 400 K/uL  Basic metabolic panel     Status: Abnormal   Collection Time: 10/28/14  5:35 AM  Result Value Ref Range   Sodium 136 135 - 145 mmol/L   Potassium 3.9 3.5 - 5.1 mmol/L   Chloride 105 101 - 111 mmol/L   CO2 24 22 - 32 mmol/L   Glucose, Bld 89 65 - 99 mg/dL   BUN 12 6 - 20 mg/dL   Creatinine, Ser 3.08 0.44 - 1.00 mg/dL   Calcium 8.6 (L) 8.9 - 10.3 mg/dL   GFR calc non Af Amer >60 >60 mL/min   GFR calc Af Amer >60 >60 mL/min   Anion gap 7 5 - 15  Urinalysis, Routine w reflex microscopic (not at [facility])     Status: Abnormal    Collection Time: 10/28/14  5:45 AM  Result Value Ref Range   Color, Urine YELLOW YELLOW   APPearance CLEAR CLEAR   Specific Gravity, Urine 1.020 1.005 - 1.030   pH 5.5 5.0 - 8.0   Glucose, UA NEGATIVE NEGATIVE mg/dL   Hgb urine dipstick SMALL (A) NEGATIVE   Bilirubin Urine NEGATIVE NEGATIVE   Ketones, ur 15 (A) NEGATIVE mg/dL   Protein, ur NEGATIVE NEGATIVE mg/dL   Urobilinogen, UA 0.2 0.0 - 1.0 mg/dL   Nitrite NEGATIVE NEGATIVE   Leukocytes, UA NEGATIVE NEGATIVE  Urine rapid drug screen (hosp performed)     Status: Abnormal   Collection Time: 10/28/14  5:45 AM  Result Value Ref Range   Opiates POSITIVE (A) NONE DETECTED   Cocaine NONE DETECTED NONE DETECTED   Benzodiazepines NONE DETECTED NONE DETECTED   Amphetamines NONE DETECTED NONE DETECTED   Tetrahydrocannabinol NONE DETECTED NONE DETECTED   Barbiturates NONE DETECTED NONE DETECTED  Urine microscopic-add on     Status: None   Collection Time: 10/28/14  5:45 AM  Result Value Ref Range   Squamous Epithelial / LPF RARE RARE   WBC, UA 0-2 <3 WBC/hpf   RBC / HPF 3-6 <3 RBC/hpf   Bacteria, UA RARE RARE   Urine-Other MUCOUS PRESENT  Glucose, capillary     Status: None   Collection Time: 10/28/14  7:27 AM  Result Value Ref Range   Glucose-Capillary 90 65 - 99 mg/dL   Dg Chest 2 View  1/61/0960   CLINICAL DATA:  Central chest pain radiating to left arm, neck, back and jaw for 7 hours. Diaphoresis, weakness and shortness of breath.  EXAM: CHEST  2 VIEW  COMPARISON:  12/31/2010  FINDINGS: The cardiomediastinal contours are normal. The lungs are clear. Pulmonary vasculature is normal. No consolidation, pleural effusion, or pneumothorax. No acute osseous abnormalities are seen.  IMPRESSION: No acute pulmonary process.   Electronically Signed   By: Rubye Oaks M.D.   On: 10/27/2014 18:11   Ct Angio Chest Pe W/cm &/or Wo Cm  10/27/2014   CLINICAL DATA:  Mid chest and back pain since this morning. Shortness of breath. Rule out  aortic dissection or pulmonary embolism.  EXAM: CT ANGIOGRAPHY CHEST WITH CONTRAST  TECHNIQUE: Multidetector CT imaging of the chest was performed using the standard protocol during bolus administration of intravenous contrast. Multiplanar CT image reconstructions and MIPs were obtained to evaluate the vascular anatomy.  CONTRAST:  OMNIPAQUE IOHEXOL 350 MG/ML SOLN  COMPARISON:  Plain film of earlier today.  No prior CT.  FINDINGS: Mediastinum/Nodes: The quality of this exam for evaluation of pulmonary embolism is moderate. The bolus is suboptimally timed, with contrast centered in the aorta. No central pulmonary embolism. No lobar or large segmental embolism identified.  Normal appearance of the great vessels. Normal aortic caliber without dissection. There is aortic atherosclerosis. There may be an LAD coronary artery stent. Subcentimeter right thyroid nodule is nonspecific. No mediastinal or hilar adenopathy. Small hiatal hernia  Lungs/Pleura: No pleural fluid. Minimal subsegmental atelectasis in the posterior left upper lobe.  Upper abdomen: Old granulomatous disease in the right lobe of the liver. Normal imaged portions of the spleen, pancreas, adrenal glands, kidneys.  Musculoskeletal: No acute osseous abnormality.  Review of the MIP images confirms the above findings.  IMPRESSION: 1. Suboptimal bolus timing. No large pulmonary embolism with limitations above. 2. Aortic atherosclerosis without dissection or aneurysm. 3. Probable LAD coronary artery stent versus age advanced atherosclerosis. Clinical history describes prior angioplasty in 2010. 4. Small hiatal hernia.   Electronically Signed   By: Jeronimo Greaves M.D.   On: 10/27/2014 19:06    ECG: Sinus rhythm at 89 bpm.  ASSESSMENT/PLAN: 38F with hypertension, hyperlipidemia, OSA, GERD and CAD s/p PCI here with chest pain, cough and low-grade fever.  Ms. Latendresse has somewhat atypical symptoms in the setting of a fever and cough. She has previously  undergone coronary angiography that did not reveal any significant coronary lesions. She had a stress test last year that was negative for ischemia. Given that she has now had 3 sets of negative cardiac enzymes, we will plan to follow her up as an outpatient for stress testing and in clinic. It is okay to discontinue heparin at this time. Would restart her home medications including her diuretic and metoprolol. Agree with increasing her atorvastatin to 80 mg given her history of coronary artery disease. Continue aspirin 81 mg daily and patient will use nitroglycerin as needed for chest pain.    Signed: Madilyn Hook, MD 10/28/2014, 9:03 AM

## 2014-10-29 LAB — HEMOGLOBIN A1C
HEMOGLOBIN A1C: 5.8 % — AB (ref 4.8–5.6)
Mean Plasma Glucose: 120 mg/dL

## 2014-11-02 LAB — CULTURE, BLOOD (ROUTINE X 2)
Culture: NO GROWTH
Culture: NO GROWTH

## 2014-11-10 ENCOUNTER — Encounter: Payer: No Typology Code available for payment source | Admitting: Cardiology

## 2014-11-13 ENCOUNTER — Encounter: Payer: Self-pay | Admitting: Cardiology

## 2016-08-03 ENCOUNTER — Emergency Department (HOSPITAL_COMMUNITY): Payer: Self-pay

## 2016-08-03 ENCOUNTER — Observation Stay (HOSPITAL_COMMUNITY)
Admission: EM | Admit: 2016-08-03 | Discharge: 2016-08-04 | Disposition: A | Discharge status: Home / Self Care | Payer: Self-pay | Attending: Internal Medicine | Admitting: Internal Medicine

## 2016-08-03 ENCOUNTER — Encounter (HOSPITAL_COMMUNITY): Payer: Self-pay

## 2016-08-03 DIAGNOSIS — I10 Essential (primary) hypertension: Secondary | ICD-10-CM | POA: Insufficient documentation

## 2016-08-03 DIAGNOSIS — Z8249 Family history of ischemic heart disease and other diseases of the circulatory system: Secondary | ICD-10-CM | POA: Insufficient documentation

## 2016-08-03 DIAGNOSIS — R079 Chest pain, unspecified: Principal | ICD-10-CM | POA: Insufficient documentation

## 2016-08-03 DIAGNOSIS — Z833 Family history of diabetes mellitus: Secondary | ICD-10-CM | POA: Insufficient documentation

## 2016-08-03 DIAGNOSIS — F419 Anxiety disorder, unspecified: Secondary | ICD-10-CM | POA: Insufficient documentation

## 2016-08-03 DIAGNOSIS — Z7982 Long term (current) use of aspirin: Secondary | ICD-10-CM | POA: Insufficient documentation

## 2016-08-03 DIAGNOSIS — K219 Gastro-esophageal reflux disease without esophagitis: Secondary | ICD-10-CM | POA: Insufficient documentation

## 2016-08-03 DIAGNOSIS — I25810 Atherosclerosis of coronary artery bypass graft(s) without angina pectoris: Secondary | ICD-10-CM | POA: Insufficient documentation

## 2016-08-03 DIAGNOSIS — Z90710 Acquired absence of both cervix and uterus: Secondary | ICD-10-CM | POA: Insufficient documentation

## 2016-08-03 DIAGNOSIS — F329 Major depressive disorder, single episode, unspecified: Secondary | ICD-10-CM | POA: Insufficient documentation

## 2016-08-03 DIAGNOSIS — E876 Hypokalemia: Secondary | ICD-10-CM | POA: Insufficient documentation

## 2016-08-03 DIAGNOSIS — E785 Hyperlipidemia, unspecified: Secondary | ICD-10-CM | POA: Insufficient documentation

## 2016-08-03 DIAGNOSIS — R110 Nausea: Secondary | ICD-10-CM | POA: Insufficient documentation

## 2016-08-03 DIAGNOSIS — E7800 Pure hypercholesterolemia, unspecified: Secondary | ICD-10-CM | POA: Insufficient documentation

## 2016-08-03 DIAGNOSIS — Z87442 Personal history of urinary calculi: Secondary | ICD-10-CM | POA: Insufficient documentation

## 2016-08-03 DIAGNOSIS — M1990 Unspecified osteoarthritis, unspecified site: Secondary | ICD-10-CM | POA: Insufficient documentation

## 2016-08-03 DIAGNOSIS — Z79899 Other long term (current) drug therapy: Secondary | ICD-10-CM | POA: Insufficient documentation

## 2016-08-03 DIAGNOSIS — R42 Dizziness and giddiness: Secondary | ICD-10-CM | POA: Insufficient documentation

## 2016-08-03 DIAGNOSIS — R0600 Dyspnea, unspecified: Secondary | ICD-10-CM | POA: Insufficient documentation

## 2016-08-03 DIAGNOSIS — Z955 Presence of coronary angioplasty implant and graft: Secondary | ICD-10-CM | POA: Insufficient documentation

## 2016-08-03 DIAGNOSIS — G4733 Obstructive sleep apnea (adult) (pediatric): Secondary | ICD-10-CM | POA: Insufficient documentation

## 2016-08-03 LAB — BASIC METABOLIC PANEL
ANION GAP: 9 (ref 5–15)
BUN: 19 mg/dL (ref 6–20)
CALCIUM: 9.1 mg/dL (ref 8.9–10.3)
CO2: 25 mmol/L (ref 22–32)
CREATININE: 0.76 mg/dL (ref 0.44–1.00)
Chloride: 107 mmol/L (ref 101–111)
GFR calc Af Amer: >60 mL/min (ref >60)
GFR calc non Af Amer: >60 mL/min (ref >60)
GLUCOSE: 104 mg/dL — AB (ref 65–99)
Potassium: 4.1 mmol/L (ref 3.5–5.1)
Sodium: 141 mmol/L (ref 135–145)

## 2016-08-03 LAB — CBC
HCT: 43.8 % (ref 36.0–46.0)
HEMOGLOBIN: 14.4 g/dL (ref 12.0–15.0)
MCH: 30.4 pg (ref 26.0–34.0)
MCHC: 32.9 g/dL (ref 30.0–36.0)
MCV: 92.4 fL (ref 78.0–100.0)
PLATELETS: 328 10*3/uL (ref 150–400)
RBC: 4.74 MIL/uL (ref 3.87–5.11)
RDW: 13.4 % (ref 11.5–15.5)
WBC: 6.8 10*3/uL (ref 4.0–10.5)

## 2016-08-03 LAB — HEPATIC FUNCTION PANEL
ALBUMIN: 3.9 g/dL (ref 3.5–5.0)
ALK PHOS: 67 U/L (ref 38–126)
ALT: 26 U/L (ref 14–54)
AST: 24 U/L (ref 15–41)
BILIRUBIN DIRECT: 0.1 mg/dL (ref 0.1–0.5)
BILIRUBIN INDIRECT: 0.3 mg/dL (ref 0.3–0.9)
BILIRUBIN TOTAL: 0.4 mg/dL (ref 0.3–1.2)
Total Protein: 7.6 g/dL (ref 6.5–8.1)

## 2016-08-03 LAB — LIPASE, BLOOD: Lipase: 32 U/L (ref 11–51)

## 2016-08-03 LAB — D-DIMER, QUANTITATIVE: D-Dimer, Quant: 0.29 ug/mL-FEU (ref 0.00–0.50)

## 2016-08-03 LAB — I-STAT TROPONIN, ED
TROPONIN I, POC: 0 ng/mL (ref 0.00–0.08)
TROPONIN I, POC: 0.01 ng/mL (ref 0.00–0.08)

## 2016-08-03 MED ORDER — ACETAMINOPHEN 325 MG PO TABS: 650.0000 mg | ORAL_TABLET | Freq: Four times a day (QID) | ORAL | Status: DC | PRN

## 2016-08-03 MED ORDER — PANTOPRAZOLE SODIUM 40 MG PO TBEC
40.0000 mg | DELAYED_RELEASE_TABLET | Freq: Every day | ORAL | Status: DC
  Administered 2016-08-04 (×2): 40 mg via ORAL
  Filled 2016-08-03 (×2): qty 1

## 2016-08-03 MED ORDER — ASPIRIN 81 MG PO CHEW
324.0000 mg | CHEWABLE_TABLET | Freq: Once | ORAL | Status: AC
  Administered 2016-08-03: 324 mg via ORAL
  Filled 2016-08-03: qty 4

## 2016-08-03 MED ORDER — IPRATROPIUM-ALBUTEROL 0.5-2.5 (3) MG/3ML IN SOLN: 3.0000 mL | Freq: Once | RESPIRATORY_TRACT | Status: DC

## 2016-08-03 MED ORDER — ENOXAPARIN SODIUM 40 MG/0.4ML ~~LOC~~ SOLN: 40.0000 mg | SUBCUTANEOUS | Status: DC

## 2016-08-03 MED ORDER — TRIAMTERENE-HCTZ 37.5-25 MG PO TABS
1.0000 | ORAL_TABLET | Freq: Every day | ORAL | Status: DC
  Administered 2016-08-04: 1 via ORAL
  Filled 2016-08-03: qty 1

## 2016-08-03 MED ORDER — ENOXAPARIN SODIUM 40 MG/0.4ML ~~LOC~~ SOLN
40.0000 mg | Freq: Every day | SUBCUTANEOUS | Status: DC
  Administered 2016-08-04: 40 mg via SUBCUTANEOUS
  Filled 2016-08-03: qty 0.4

## 2016-08-03 MED ORDER — FAMOTIDINE IN NACL 20-0.9 MG/50ML-% IV SOLN
20.0000 mg | Freq: Once | INTRAVENOUS | Status: AC
  Administered 2016-08-03: 20 mg via INTRAVENOUS
  Filled 2016-08-03: qty 50

## 2016-08-03 MED ORDER — GI COCKTAIL ~~LOC~~
30.0000 mL | Freq: Once | ORAL | Status: AC
  Administered 2016-08-03: 30 mL via ORAL
  Filled 2016-08-03: qty 30

## 2016-08-03 MED ORDER — ASPIRIN EC 81 MG PO TBEC
81.0000 mg | DELAYED_RELEASE_TABLET | Freq: Every day | ORAL | Status: DC
  Administered 2016-08-04: 81 mg via ORAL
  Filled 2016-08-03: qty 1

## 2016-08-03 MED ORDER — ONDANSETRON HCL 4 MG/2ML IJ SOLN: 4.0000 mg | Freq: Four times a day (QID) | INTRAMUSCULAR | Status: DC | PRN

## 2016-08-03 MED ORDER — METOPROLOL SUCCINATE ER 25 MG PO TB24
25.0000 mg | ORAL_TABLET | Freq: Every day | ORAL | Status: DC
  Administered 2016-08-04: 25 mg via ORAL
  Filled 2016-08-03: qty 1

## 2016-08-03 NOTE — ED Triage Notes (Addendum)
Triage completed using interpreter device: pt has been having chest pain, shortness of breath and numbness in her hands and feet which began around 0400. Pt appears slightly short of breath. Skin warm and dry. Pt states she has hx of cardiac cath. Pt also states feeling a lump in her chest that has gotten more sore.

## 2016-08-03 NOTE — H&P (Signed)
History and Physical    Ninetta Adelstein MVH:[id number] DOB: 1960/10/21 DOA: 08/03/2016  PCP: Gus Rankin, PA-C   Patient coming from: Home.  I have personally briefly reviewed patient's old medical records in [facility]  Chief Complaint: Chest pain.  HPI: Kiara Sanchez is a 56 y.o. female with medical history significant of osteoarthritis, coronary artery disease with history of PTCA and multiple stent placements, GERD, hyperlipidemia, hypertension, urolithiasis, OSA on CPAP, history of pneumonia who is coming to emergency department with complaints of chest pain associated with dyspnea, mild nausea, mild dizziness and numbness of hands and feet since about 0400 today. Pain is pressure-like, radiates to her back and left forearm, but denies palpitations, diaphoresis, PND, orthopnea or recent pitting edema lower extremities. She complains that her skin on both hands and feet feel like crawling ants. She mentions that for the past 4 months or so she has been having dyspnea on exertion with occasional chest pressure sensation triggered by doing house chores or even walking up stairs. She has not taken daily aspirin in about 6 months. She is not currently taking atorvastatin due to myalgias side effect.She denies abdominal pain, emesis, diarrhea, constipation, melena or hematochezia. She denies blurred vision, polyuria, polydipsia, polyphagia, dysuria, frequency or hematuria. She denies skin rashes or pruritus.   ED Course: The patient was afebrile, her pulse initially was 76, blood pressure 164/82 mmHg, respirations 16 and O2 sat 99% on room air. She received aspirin 324 mg, GI cocktail and famotidine 20 mg IVP 1 dose in the ER. Her troponin level was normal. EKG was normal sinus rhythm without significant changes. Her chest radiograph did not show any acute cardiopulmonary pathology. Her CBC and d-dimer were normal. Chemistry shows a nonfasting glucose level of 104 mg/dL, but is otherwise  normal.  Review of Systems: As per HPI otherwise 10 point review of systems negative.    Past Medical History:  Diagnosis Date  . Arthritis    "all over"  . Coronary artery disease   . Family history of adverse reaction to anesthesia    "sister almost stopped breathing"   . GERD (gastroesophageal reflux disease)   . Hypercholesterolemia   . Hypertension   . Kidney stones    remote hx/notes 10/25/2008  . OSA on CPAP   . Pneumonia ~ 2014    Past Surgical History:  Procedure Laterality Date  . ABDOMINAL HYSTERECTOMY    . APPENDECTOMY    . CARDIAC CATHETERIZATION  03/2009   Cline Crock 04/23/2009  . CORONARY ANGIOPLASTY WITH STENT PLACEMENT  ~ 2006   [facility] [employer] in [place]   . REFRACTIVE SURGERY Bilateral 1990's  . SUPRAVENTRICULAR TACHYCARDIA ABLATION     [employer] [employer] in [place]      reports that she has never smoked. She has never used smokeless tobacco. She reports that she drinks alcohol. She reports that she does not use drugs.  No Known Allergies  Family History  Problem Relation Age of Onset  . Diabetes Mother   . Heart attack Father   . Heart disease Sister     Prior to Admission medications   Medication Sig Start Date End Date Taking? Authorizing Provider  cetirizine (ZYRTEC) 10 MG tablet Take 10 mg by mouth daily as needed for allergies.   Yes [provider]  COLLAGEN PO Take 1 capsule by mouth daily. For muscle pain   Yes [provider]  ibuprofen (ADVIL,MOTRIN) 200 MG tablet Take 400 mg by mouth every 6 (six) hours  as needed for headache (pain).   Yes [provider]  Ketotifen Fumarate (ALLERGY EYE DROPS OP) Place 1 drop into both eyes daily as needed (itching/allergies).   Yes [provider]  metoprolol succinate (TOPROL-XL) 25 MG 24 hr tablet Take 25 mg by mouth daily.   Yes [provider]  pantoprazole (PROTONIX) 20 MG tablet Take 1 tablet (20 mg total) by mouth daily. 10/28/14  Yes Joseph Art, DO  PRESCRIPTION MEDICATION Inhale into the lungs at bedtime. CPAP   Yes [provider]  triamterene-hydrochlorothiazide (MAXZIDE-25) 37.5-25 MG tablet Take 1 tablet by mouth daily.   Yes [provider]  aspirin EC 81 MG tablet Take 1 tablet (81 mg total) by mouth daily. Patient not taking: Reported on 08/03/2016 07/31/13   Lars Masson, MD  atorvastatin (LIPITOR) 80 MG tablet Take 1 tablet (80 mg total) by mouth daily at 6 PM. Patient not taking: Reported on 08/03/2016 10/28/14   Joseph Art, DO    Physical Exam: Vitals:   08/03/16 1909 08/03/16 1930 08/03/16 2029 08/03/16 2100  BP: 135/72 133/68  132/73  Pulse: 73 74  74  Resp:  (!) 21  15  Temp:   98.6 F (37 C)   TempSrc:   Oral   SpO2: 94% 97%  96%    Constitutional: NAD, calm, comfortable Eyes: PERRL, lids and conjunctivae normal ENMT: Mucous membranes are moist. Posterior pharynx clear of any exudate or lesions. Neck: normal, supple, no masses, no thyromegaly Respiratory: clear to auscultation bilaterally, no wheezing, no crackles. Normal respiratory effort. No accessory muscle use.  Cardiovascular: Regular rate and rhythm, no murmurs / rubs / gallops. No extremity edema. 2+ pedal pulses. No carotid bruits.  Abdomen: Soft, no tenderness, no masses palpated. No hepatosplenomegaly. Bowel sounds positive.  Musculoskeletal: no clubbing / cyanosis. Good ROM, no contractures. Normal muscle tone.  Skin: no rashes, lesions, ulcers  Neurologic: CN 2-12 grossly intact. Sensation intact, DTR normal. Strength 5/5 in all 4.  Psychiatric: Normal judgment and insight. Alert and oriented x 4. Normal mood.    Labs on Admission: I have personally reviewed following labs and imaging studies  CBC:  Recent Labs Lab 08/03/16 1711  WBC 6.8  HGB 14.4  HCT 43.8  MCV 92.4  PLT 328   Basic Metabolic Panel:  Recent Labs Lab 08/03/16 1711  NA 141  K 4.1  CL 107  CO2 25  GLUCOSE 104*  BUN 19   CREATININE 0.76  CALCIUM 9.1   GFR: CrCl cannot be calculated (Unknown ideal weight.). Liver Function Tests:  Recent Labs Lab 08/03/16 1900  AST 24  ALT 26  ALKPHOS 67  BILITOT 0.4  PROT 7.6  ALBUMIN 3.9    Recent Labs Lab 08/03/16 1900  LIPASE 32   No results for input(s): AMMONIA in the last 168 hours. Coagulation Profile: No results for input(s): INR, PROTIME in the last 168 hours. Cardiac Enzymes: No results for input(s): CKTOTAL, CKMB, CKMBINDEX, TROPONINI in the last 168 hours. BNP (last 3 results) No results for input(s): PROBNP in the last 8760 hours. HbA1C: No results for input(s): HGBA1C in the last 72 hours. CBG: No results for input(s): GLUCAP in the last 168 hours. Lipid Profile: No results for input(s): CHOL, HDL, LDLCALC, TRIG, CHOLHDL, LDLDIRECT in the last 72 hours. Thyroid Function Tests: No results for input(s): TSH, T4TOTAL, FREET4, T3FREE, THYROIDAB in the last 72 hours. Anemia Panel: No results for input(s): VITAMINB12, FOLATE, FERRITIN, TIBC, IRON,  RETICCTPCT in the last 72 hours. Urine analysis:    Component Value Date/Time   COLORURINE YELLOW 10/28/2014 0545   APPEARANCEUR CLEAR 10/28/2014 0545   LABSPEC 1.020 10/28/2014 0545   PHURINE 5.5 10/28/2014 0545   GLUCOSEU NEGATIVE 10/28/2014 0545   HGBUR SMALL (A) 10/28/2014 0545   BILIRUBINUR NEGATIVE 10/28/2014 0545   KETONESUR 15 (A) 10/28/2014 0545   PROTEINUR NEGATIVE 10/28/2014 0545   UROBILINOGEN 0.2 10/28/2014 0545   NITRITE NEGATIVE 10/28/2014 0545   LEUKOCYTESUR NEGATIVE 10/28/2014 0545    Radiological Exams on Admission: Dg Chest 2 View  Result Date: 08/03/2016 CLINICAL DATA:  Pt has been having substernal chest pain and burning that radiates to her back and the left lower chest with shortness of breath onset last night. Symptoms worse today. Hx of angioplasty and HTN per husband. Non-smoker EXAM: CHEST  2 VIEW COMPARISON:  10/27/2014, chest x-ray and chest CT FINDINGS: The  heart size and mediastinal contours are within normal limits. Both lungs are clear. The visualized skeletal structures are unremarkable. IMPRESSION: No active cardiopulmonary disease. Electronically Signed   By: Norva PavlovElizabeth  Brown M.D.   On: 08/03/2016 17:55   08/08/2013 Cardiology nuclear medicine study  Impression Exercise Capacity:  Lexiscan with no exercise. BP Response:  Normal blood pressure response. Clinical Symptoms:  No significant symptoms noted. ECG Impression:  No significant ST segment change suggestive of ischemia. Comparison with Prior Nuclear Study: No images to compare  Overall Impression:  Normal stress nuclear study.  There is mild attenuation of the distal anterior wall this is most consistent with breast artifact.   LV Ejection Fraction: 66%.  LV Wall Motion:  NL LV Function; NL Wall Motion.  EKG: Independently reviewed. Vent. rate 87 BPM PR interval 178 ms QRS duration 84 ms QT/QTc 386/464 ms P-R-T axes 52 50 51 Normal sinus rhythm Normal ECG  Assessment/Plan Principal Problem:   Chest pain Observation/telemetry. Supplemental oxygen as needed. Resume aspirin 81 mg by mouth daily. Nitroglycerin SL as needed. Trend troponin levels. Keep nothing by mouth tonight. Check NM a stress test with EF in a.m. if possible.  Active Problems:   CAD (coronary artery disease) of artery bypass graft Advised about the need to resume aspirin and statin therapy. Continue metoprolol ER 25 mg po QD.    Hyperlipidemia Not on therapy at this time. Advised about the need to keep LDL cholesterol under 70 mg/dL. She should discuss this issue with primary care provider.    Essential hypertension Continue Metoprolol succinate 25 mg po daily.    GERD (gastroesophageal reflux disease) Famotidine 20 mg IVPB x 1 dose. Continue Protonix daily. Advised to avoid NSAIDs and spicy foods     DVT prophylaxis: Lovenox SQ. Code Status: Full code. Family Communication: Her  husband Minerva Areolaric.  Disposition Plan:  Consults called:  Admission status: Observation/Telemetry.   Bobette Moavid Manuel Ortiz MD [facility] Pager [phone]  If 7PM-7AM, please contact night-coverage [email or web address] Password [email or web address]  08/03/2016, 9:12 PM

## 2016-08-03 NOTE — ED Provider Notes (Signed)
[facility]-EMERGENCY DEPT Provider Note   CSN: [id number] Arrival date & time: 08/03/16  1702     History   Chief Complaint Chief Complaint  Patient presents with  . Chest Pain    HPI Kiara Sanchez is a 56 y.o. female.  HPI Kiara Sanchez is a 56 y.o. female with hx of CAD, arthritis, GERD, HTN, Presents to ED with complaint of chest pain and SOB. Patient has had exertional chest tightness that starts in the left chest and radiates bilaterally around the back, states for approximately 4 months. She reports it is getting worse. It is associated with shortness of breath. She states symptoms onset with even mild exertion such cleaning her house or walking flight of stairs. She states that she has to lay down when pain starts and it makes her pain get better. She reports associated nausea. She states this morning her symptoms began around 3 AM while she was sleeping, she reports associated tingling in bilateral hands and feet when her pain started today, decided to "wait it out" but when her symptoms persisted she came to emergency department. She states she is admitted for similar symptoms to years ago and had a negative stress test at that time. She reports associated epigastric pain, she states she feels like there is a mass there. She states that this area is tender and is worse when palpating. Denies any vomiting. No diarrhea. No urinary symptoms. Currently pain-free.  Past Medical History:  Diagnosis Date  . Arthritis    "all over"  . Coronary artery disease   . Family history of adverse reaction to anesthesia    "sister almost stopped breathing"   . GERD (gastroesophageal reflux disease)   . Hypercholesterolemia   . Hypertension   . Kidney stones    remote hx/notes 10/25/2008  . OSA on CPAP   . Pneumonia ~ 2014    Patient Active Problem List   Diagnosis Date Noted  . Chest pain 10/27/2014  . Hypokalemia 10/27/2014  . Fever 10/27/2014  . Depression 10/27/2014  . CAD (coronary artery  disease) of artery bypass graft 10/27/2014  . Anxiety   . GERD (gastroesophageal reflux disease)   . Cough   . Essential hypertension 07/31/2013  . Hyperlipidemia 07/31/2013  . Palpitations 07/31/2013  . Paresthesias 06/27/2013    Past Surgical History:  Procedure Laterality Date  . ABDOMINAL HYSTERECTOMY    . APPENDECTOMY    . CARDIAC CATHETERIZATION  03/2009   Cline Crock 04/23/2009  . CORONARY ANGIOPLASTY WITH STENT PLACEMENT  ~ 2006   [facility] [employer] in [place]   . REFRACTIVE SURGERY Bilateral 1990's  . SUPRAVENTRICULAR TACHYCARDIA ABLATION     [employer] in [place]     [place] History    No data available       Home Medications    Prior to Admission medications   Medication Sig Start Date End Date Taking? Authorizing Provider  aspirin EC 81 MG tablet Take 1 tablet (81 mg total) by mouth daily. 07/31/13   Lars Masson, MD  atorvastatin (LIPITOR) 80 MG tablet Take 1 tablet (80 mg total) by mouth daily at 6 PM. 10/28/14   Joseph Art, DO  azithromycin (ZITHROMAX) 250 MG tablet Take 1 tablet (250 mg total) by mouth daily. 10/28/14   Joseph Art, DO  loratadine (CLARITIN) 10 MG tablet  05/21/13   [provider]  metoprolol succinate (TOPROL-XL) 25 MG 24 hr tablet Take 25 mg by mouth daily.    [provider]  pantoprazole (PROTONIX) 20 MG tablet Take 1 tablet (20 mg total) by mouth daily. 10/28/14   Joseph Art, DO    Family History Family History  Problem Relation Age of Onset  . Diabetes Mother   . Heart attack Father   . Heart disease Sister     Social History Social History  Substance Use Topics  . Smoking status: Never Smoker  . Smokeless tobacco: Never Used  . Alcohol use Yes     Comment: 10/27/2014 "glass of wine q couple months"     Allergies   Patient has no known allergies.   Review of Systems Review of Systems  Constitutional: Negative for chills and fever.  Respiratory: Positive for chest tightness  and shortness of breath. Negative for cough.   Cardiovascular: Positive for chest pain. Negative for palpitations and leg swelling.  Gastrointestinal: Positive for abdominal pain and nausea. Negative for diarrhea and vomiting.  Genitourinary: Negative for dysuria, flank pain, pelvic pain, vaginal bleeding, vaginal discharge and vaginal pain.  Musculoskeletal: Negative for arthralgias, myalgias, neck pain and neck stiffness.  Skin: Negative for rash.  Neurological: Negative for dizziness, weakness and headaches.  All other systems reviewed and are negative.    Physical Exam Updated Vital Signs BP 135/72   Pulse 73   Resp (!) 21   LMP 12/24/2001   SpO2 94%   Physical Exam  Constitutional: She is oriented to person, place, and time. She appears well-developed and well-nourished. No distress.  HENT:  Head: Normocephalic.  Eyes: Conjunctivae are normal.  Neck: Neck supple.  Cardiovascular: Normal rate, regular rhythm and normal heart sounds.   Pulmonary/Chest: Effort normal and breath sounds normal. No respiratory distress. She has no wheezes. She has no rales.  Abdominal: Soft. Bowel sounds are normal. She exhibits no distension. There is tenderness. There is no rebound.  Epigastric tenderness  Musculoskeletal: She exhibits no edema.  Neurological: She is alert and oriented to person, place, and time.  Skin: Skin is warm and dry.  Psychiatric: She has a normal mood and affect. Her behavior is normal.  Nursing note and vitals reviewed.    ED Treatments / Results  Labs (all labs ordered are listed, but only abnormal results are displayed) Labs Reviewed  BASIC METABOLIC PANEL - Abnormal; Notable for the following:       Result Value   Glucose, Bld 104 (*)    All other components within normal limits  CBC  D-DIMER, QUANTITATIVE (NOT AT [facility])  HEPATIC FUNCTION PANEL  LIPASE, BLOOD  I-STAT TROPOININ, ED    EKG  EKG Interpretation  Date/Time:  Wednesday August 03 2016  17:06:06 EDT Ventricular Rate:  87 PR Interval:  178 QRS Duration: 84 QT Interval:  386 QTC Calculation: 464 R Axis:   50 Text Interpretation:  Normal sinus rhythm Normal ECG No significant change since last tracing Confirmed by Alvira Monday ([id number]) on 08/03/2016 8:21:21 PM       Radiology Dg Chest 2 View  Result Date: 08/03/2016 CLINICAL DATA:  Pt has been having substernal chest pain and burning that radiates to her back and the left lower chest with shortness of breath onset last night. Symptoms worse today. Hx of angioplasty and HTN per husband. Non-smoker EXAM: CHEST  2 VIEW COMPARISON:  10/27/2014, chest x-ray and chest CT FINDINGS: The heart size and mediastinal contours are within normal limits. Both lungs are clear. The visualized skeletal structures are unremarkable. IMPRESSION: No active cardiopulmonary disease. Electronically Signed  By: Norva PavlovElizabeth  Brown M.D.   On: 08/03/2016 17:55    Procedures Procedures (including critical care time)  Medications Ordered in ED Medications - No data to display   Initial Impression / Assessment and Plan / ED Course  I have reviewed the triage vital signs and the nursing notes.  Pertinent labs & imaging results that were available during my care of the patient were reviewed by me and considered in my medical decision making (see chart for details).    Patient with exertional chest pain, shortness of breath, nausea, symptoms for several months but apparently worsened today. Also reports some epigastric pain that is worse with palpation. Currently asymptomatic until I pressed on her abdomen which resulted in increased epigastric abdominal pain. Patient did have a workup for similar symptoms 2 years ago, had a CT angiogram which showed coronary artery calcification, Negative stress test and holter monitor. Pt apparently had cardiac cath and stent placed in [facility]? [facility] [place] [facility]. We will check labs, EKG, troponin, chest x-ray, d-dimer,  LFTs and lipase.  Patient's labs unremarkable. She appears to be comfortable. Given history of coronary disease with stenting, no recent workup, will admit for rule out and further testing. Discussed results with patient and her husband, agreed to the plan.   Vitals:   08/03/16 1930 08/03/16 2029 08/03/16 2100 08/03/16 2224  BP: 133/68  132/73 (!) 141/70  Pulse: 74  74 67  Resp: (!) 21  15 16   Temp:  98.6 F (37 C)  97.7 F (36.5 C)  TempSrc:  Oral  Oral  SpO2: 97%  96% 97%  Weight:    78.9 kg (173 lb 14.4 oz)     Final Clinical Impressions(s) / ED Diagnoses   Final diagnoses:  Chest pain    New Prescriptions Current Discharge Medication List       Jaynie CrumbleKirichenko, Lavontae Cornia, Cordelia Poche-C 08/03/16 2329    Alvira MondaySchlossman, Erin, MD 08/05/16 1325

## 2016-08-04 ENCOUNTER — Observation Stay (HOSPITAL_BASED_OUTPATIENT_CLINIC_OR_DEPARTMENT_OTHER): Payer: Self-pay

## 2016-08-04 DIAGNOSIS — I25810 Atherosclerosis of coronary artery bypass graft(s) without angina pectoris: Secondary | ICD-10-CM

## 2016-08-04 DIAGNOSIS — R072 Precordial pain: Secondary | ICD-10-CM

## 2016-08-04 DIAGNOSIS — R079 Chest pain, unspecified: Secondary | ICD-10-CM

## 2016-08-04 LAB — GLUCOSE, CAPILLARY: GLUCOSE-CAPILLARY: 102 mg/dL — AB (ref 65–99)

## 2016-08-04 LAB — URINALYSIS, ROUTINE W REFLEX MICROSCOPIC
BILIRUBIN URINE: NEGATIVE
Glucose, UA: NEGATIVE mg/dL
Hgb urine dipstick: NEGATIVE
Ketones, ur: NEGATIVE mg/dL
Leukocytes, UA: NEGATIVE
NITRITE: NEGATIVE
PROTEIN: NEGATIVE mg/dL
SPECIFIC GRAVITY, URINE: 1.02 (ref 1.005–1.030)
pH: 6 (ref 5.0–8.0)

## 2016-08-04 LAB — NM MYOCAR MULTI W/SPECT W/WALL MOTION / EF
CSEPED: 5 min
CSEPEW: 1 METS
CSEPPHR: 103 {beats}/min
Exercise duration (sec): 18 s
Rest HR: 75 {beats}/min

## 2016-08-04 LAB — BRAIN NATRIURETIC PEPTIDE: B NATRIURETIC PEPTIDE 5: 30.9 pg/mL (ref 0.0–100.0)

## 2016-08-04 LAB — TROPONIN I: Troponin I: <0.03 ng/mL (ref <0.03)

## 2016-08-04 LAB — MAGNESIUM: Magnesium: 2.4 mg/dL (ref 1.7–2.4)

## 2016-08-04 LAB — HIV ANTIBODY (ROUTINE TESTING W REFLEX): HIV Screen 4th Generation wRfx: NONREACTIVE

## 2016-08-04 MED ORDER — TRAMADOL HCL 50 MG PO TABS: 50.0000 mg | ORAL_TABLET | Freq: Four times a day (QID) | ORAL | Status: DC | PRN

## 2016-08-04 MED ORDER — REGADENOSON 0.4 MG/5ML IV SOLN
INTRAVENOUS | Status: AC
  Administered 2016-08-04: 0.4 mg via INTRAVENOUS
  Filled 2016-08-04: qty 5

## 2016-08-04 MED ORDER — PANTOPRAZOLE SODIUM 20 MG PO TBEC: 40.0000 mg | DELAYED_RELEASE_TABLET | Freq: Every day | ORAL | 1 refills | Status: AC

## 2016-08-04 MED ORDER — TECHNETIUM TC 99M TETROFOSMIN IV KIT
30.0000 | PACK | Freq: Once | INTRAVENOUS | Status: AC | PRN
  Administered 2016-08-04: 30 via INTRAVENOUS

## 2016-08-04 MED ORDER — TRIAMTERENE-HCTZ 37.5-25 MG PO TABS: 1.0000 | ORAL_TABLET | Freq: Every day | ORAL | 1 refills | Status: DC

## 2016-08-04 MED ORDER — ATORVASTATIN CALCIUM 20 MG PO TABS: 20.0000 mg | ORAL_TABLET | Freq: Every day | ORAL | 1 refills | Status: DC

## 2016-08-04 MED ORDER — TECHNETIUM TC 99M TETROFOSMIN IV KIT
10.0000 | PACK | Freq: Once | INTRAVENOUS | Status: AC | PRN
  Administered 2016-08-04: 10 via INTRAVENOUS

## 2016-08-04 MED ORDER — ASPIRIN EC 81 MG PO TBEC: 81.0000 mg | DELAYED_RELEASE_TABLET | Freq: Every day | ORAL | 0 refills | Status: AC

## 2016-08-04 MED ORDER — REGADENOSON 0.4 MG/5ML IV SOLN
0.4000 mg | Freq: Once | INTRAVENOUS | Status: AC
  Administered 2016-08-04: 0.4 mg via INTRAVENOUS

## 2016-08-04 MED ORDER — ORAL CARE MOUTH RINSE
15.0000 mL | Freq: Two times a day (BID) | OROMUCOSAL | Status: DC
  Administered 2016-08-04: 15 mL via OROMUCOSAL

## 2016-08-04 MED ORDER — NITROGLYCERIN 0.4 MG SL SUBL: 0.4000 mg | SUBLINGUAL_TABLET | SUBLINGUAL | Status: DC | PRN

## 2016-08-04 MED ORDER — METOPROLOL SUCCINATE ER 25 MG PO TB24: 25.0000 mg | ORAL_TABLET | Freq: Every day | ORAL | 1 refills | Status: DC

## 2016-08-04 NOTE — Progress Notes (Signed)
Patient presented for Lexiscan. Tolerated procedure well. Pending final stress imaging result.  

## 2016-08-04 NOTE — Progress Notes (Signed)
Page to Akula,MD   3e19 return from stress test, requests to eat. stress test ok, c/o dizzy x 10 minutes now.

## 2016-08-04 NOTE — Progress Notes (Signed)
[facility] notified that patient was having pain with urination. New order for UA was given and resulted as negative. Will continue to monitor. Ilean SkillVeronica Man Effertz LPN

## 2016-08-04 NOTE — Progress Notes (Signed)
Call placed to [facility] to notify of telemetry monitoring d/c.   

## 2016-08-04 NOTE — Progress Notes (Signed)
Unable to print AVS due to printer issues on 3 east x 3 hours. AVS reviewed with patient and her husband, in both spanish and [place]lish. Pt refused to wait for printout, or wheelchair.   Agreed to print tomorrow and mail to pt.

## 2016-08-04 NOTE — Care Management Note (Addendum)
Case Management Note  Patient Details  Name: Kiara Sanchez MRN: [id number] Date of Birth: 03/19/1960  Subjective/Objective:         Chest Pain          Action/Plan: Patient lives at home with spouse; [facility] [place]; No insurance, pt was seen by the Financial Counselor to see what the patient might qualify for. Patient independent of her ADL's.Pt/spouse stated that they need assistance with medication; [facility] ([facility] Through [place]- where [facility] will assist the patient in getting a 34 day supply of medication once a year); MATCH form given to spouse with explanation of usage. CM will continue to follow for DCP.  Expected Discharge Date:  08/05/16               Expected Discharge Plan:  Home/Self Care  In-House Referral:   Financial Counselor  Discharge planning Services  CM Consult  Choice offered to:  Spouse   Status of Service:  In process, will continue to follow  Reola MosherChandler, Hargun Spurling L, RN,MHA,BSN [phone] 08/04/2016, 1:24 PM

## 2016-08-04 NOTE — Discharge Summary (Signed)
Physician Discharge Summary  Kiara Sanchez ZOX:[id number] DOB: 04/10/1960 DOA: 08/03/2016  PCP: Gus Rankin, PA-C  Admit date: 08/03/2016 Discharge date: 08/04/2016  Admitted From:Home.  Disposition:  Home.   Recommendations for Outpatient Follow-up:  1. Follow up with PCP in 1-2 weeks 2. Please obtain BMP/CBC in one week   Discharge Condition: stable.  CODE STATUS: full code.  Diet recommendation: Heart Healthy  Brief/Interim Summary: Kiara Sanchez is a 56 y.o. female with medical history significant of osteoarthritis, coronary artery disease with history of PTCA and multiple stent placements, GERD, hyperlipidemia, hypertension, urolithiasis, OSA on CPAP, history of pneumonia who is coming to emergency department with complaints of chest pain associated with dyspnea with exertion, mild nausea. curerntly she reports she is chest pain free. She reports not having insurance and couldn't fill get her medications. She was admitted for evaluation of ACS.   Discharge Diagnoses:  Principal Problem:   Chest pain Active Problems:   Essential hypertension   Hyperlipidemia   GERD (gastroesophageal reflux disease)   CAD (coronary artery disease) of artery bypass graft   CHEST PAIN: On exertion, currently chest pain free.  troponin's negative.  EKG NSR,  She underwent stress myo view showing the following.    There was no ST segment deviation noted during stress.  T wave inversion was noted during stress in the III leads.  This is a low risk study.  The left ventricular ejection fraction is normal (55-65%).  Nuclear stress EF: 55%.   She was given prescriptions for aspirin, statin , bb and blood pressure medication.  She was recommended to follow up with PCP in one week.  She was also recommended to follow up with cardiology in 2 weeks.  Differential for chest pain include musculoskeletal pain vs pleuritis vs GERD.  CXR is negative for pneumonia,  She was given prescription for  protonix .      Discharge Instructions  Discharge Instructions    Diet - low sodium heart healthy    Complete by:  As directed    Discharge instructions    Complete by:  As directed    Please follow up with PCP in one week.     Allergies as of 08/04/2016   No Known Allergies     Medication List    STOP taking these medications   ibuprofen 200 MG tablet Commonly known as:  ADVIL,MOTRIN     TAKE these medications   ALLERGY EYE DROPS OP Place 1 drop into both eyes daily as needed (itching/allergies).   aspirin EC 81 MG tablet Take 1 tablet (81 mg total) by mouth daily.   atorvastatin 20 MG tablet Commonly known as:  LIPITOR Take 1 tablet (20 mg total) by mouth daily. What changed:  medication strength  how much to take  when to take this   cetirizine 10 MG tablet Commonly known as:  ZYRTEC Take 10 mg by mouth daily as needed for allergies.   COLLAGEN PO Take 1 capsule by mouth daily. For muscle pain   metoprolol succinate 25 MG 24 hr tablet Commonly known as:  TOPROL-XL Take 1 tablet (25 mg total) by mouth daily.   pantoprazole 20 MG tablet Commonly known as:  PROTONIX Take 2 tablets (40 mg total) by mouth daily. What changed:  how much to take   PRESCRIPTION MEDICATION Inhale into the lungs at bedtime. CPAP   triamterene-hydrochlorothiazide 37.5-25 MG tablet Commonly known as:  MAXZIDE-25 Take 1 tablet by mouth daily.  Follow-up Information    Schedule an appointment as soon as possible for a visit with Gus Rankin, PA-C.   Specialty:  Pulmonary Disease Why:  The nurse will be calling the patient for follow up appointment. Contact information: [address] [place] [address] [phone]        [facility]. Schedule an appointment as soon as possible for a visit in 1 week.   Contact information: [address] [place] [address] [phone]         No  Known Allergies  Consultations:  None.    Procedures/Studies: Dg Chest 2 View  Result Date: 08/03/2016 CLINICAL DATA:  Pt has been having substernal chest pain and burning that radiates to her back and the left lower chest with shortness of breath onset last night. Symptoms worse today. Hx of angioplasty and HTN per husband. Non-smoker EXAM: CHEST  2 VIEW COMPARISON:  10/27/2014, chest x-ray and chest CT FINDINGS: The heart size and mediastinal contours are within normal limits. Both lungs are clear. The visualized skeletal structures are unremarkable. IMPRESSION: No active cardiopulmonary disease. Electronically Signed   By: Norva Pavlov M.D.   On: 08/03/2016 17:55   Nm Myocar Multi W/spect W/wall Motion / Ef  Result Date: 08/04/2016  There was no ST segment deviation noted during stress.  T wave inversion was noted during stress in the III leads.  This is a low risk study.  The left ventricular ejection fraction is normal (55-65%).  Nuclear stress EF: 55%.  1. EF 55%, normal wall motion. 2. Fixed, small mild apical perfusion defect.  Given normal wall motion, this may be attenuation.  No evidence for ischemia. Low risk study.      Subjective:  No chest pain  Or sob.  Discharge Exam: Vitals:   08/04/16 0858 08/04/16 1003  BP: 133/81 131/63  Pulse:  72  Resp:  16  Temp:  98 F (36.7 C)   Vitals:   08/04/16 0854 08/04/16 0856 08/04/16 0858 08/04/16 1003  BP: (!) 162/83 (!) 144/83 133/81 131/63  Pulse:    72  Resp:    16  Temp:    98 F (36.7 C)  TempSrc:    Oral  SpO2:    96%  Weight:      Height:        General: Pt is alert, awake, not in acute distress Cardiovascular: RRR, S1/S2 +, no rubs, no gallops Respiratory: CTA bilaterally, no wheezing, no rhonchi Abdominal: Soft, NT, ND, bowel sounds + Extremities: no edema, no cyanosis    The results of significant diagnostics from this hospitalization (including imaging, microbiology, ancillary and laboratory) are  listed below for reference.     Microbiology: No results found for this or any previous visit (from the past 240 hour(s)).   Labs: BNP (last 3 results)  Recent Labs  08/04/16 0328  BNP 30.9   Basic Metabolic Panel:  Recent Labs Lab 08/03/16 1711 08/04/16 0328  NA 141  --   K 4.1  --   CL 107  --   CO2 25  --   GLUCOSE 104*  --   BUN 19  --   CREATININE 0.76  --   CALCIUM 9.1  --   MG  --  2.4   Liver Function Tests:  Recent Labs Lab 08/03/16 1900  AST 24  ALT 26  ALKPHOS 67  BILITOT 0.4  PROT 7.6  ALBUMIN 3.9  Recent Labs Lab 08/03/16 1900  LIPASE 32   No results for input(s): AMMONIA in the last 168 hours. CBC:  Recent Labs Lab 08/03/16 1711  WBC 6.8  HGB 14.4  HCT 43.8  MCV 92.4  PLT 328   Cardiac Enzymes:  Recent Labs Lab 08/04/16 0328 08/04/16 0955  TROPONINI <0.03 <0.03   BNP: Invalid input(s): POCBNP CBG:  Recent Labs Lab 08/04/16 1105  GLUCAP 102*   D-Dimer  Recent Labs  08/03/16 1900  DDIMER 0.29   Hgb A1c No results for input(s): HGBA1C in the last 72 hours. Lipid Profile No results for input(s): CHOL, HDL, LDLCALC, TRIG, CHOLHDL, LDLDIRECT in the last 72 hours. Thyroid function studies No results for input(s): TSH, T4TOTAL, T3FREE, THYROIDAB in the last 72 hours.  Invalid input(s): FREET3 Anemia work up No results for input(s): VITAMINB12, FOLATE, FERRITIN, TIBC, IRON, RETICCTPCT in the last 72 hours. Urinalysis    Component Value Date/Time   COLORURINE YELLOW 08/04/2016 0037   APPEARANCEUR CLEAR 08/04/2016 0037   LABSPEC 1.020 08/04/2016 0037   PHURINE 6.0 08/04/2016 0037   GLUCOSEU NEGATIVE 08/04/2016 0037   HGBUR NEGATIVE 08/04/2016 0037   BILIRUBINUR NEGATIVE 08/04/2016 0037   KETONESUR NEGATIVE 08/04/2016 0037   PROTEINUR NEGATIVE 08/04/2016 0037   UROBILINOGEN 0.2 10/28/2014 0545   NITRITE NEGATIVE 08/04/2016 0037   LEUKOCYTESUR NEGATIVE 08/04/2016 0037   Sepsis Labs Invalid input(s):  PROCALCITONIN,  WBC,  LACTICIDVEN Microbiology No results found for this or any previous visit (from the past 240 hour(s)).   Time coordinating discharge: Over 30 minutes  SIGNED:   Kathlen ModyAKULA,Anzlee Hinesley, MD  [facility] 08/04/2016, 5:31 PM Pager   If 7PM-7AM, please contact night-coverage [email or web address] Password [email or web address]

## 2016-08-04 NOTE — Progress Notes (Signed)
Page to Dr Blake DivineAkula  [phone] pt reports dizziness subsided. prior to vitals WNL, glucose 102, SR HR 72. no further complaints.

## 2016-10-28 ENCOUNTER — Ambulatory Visit (INDEPENDENT_AMBULATORY_CARE_PROVIDER_SITE_OTHER): Payer: Self-pay | Admitting: Internal Medicine

## 2016-10-28 ENCOUNTER — Encounter: Payer: Self-pay | Admitting: Internal Medicine

## 2016-10-28 VITALS — BP 150/88 | HR 78 | Resp 12 | Ht 61.5 in | Wt 179.0 lb

## 2016-10-28 DIAGNOSIS — G4700 Insomnia, unspecified: Secondary | ICD-10-CM

## 2016-10-28 DIAGNOSIS — I25810 Atherosclerosis of coronary artery bypass graft(s) without angina pectoris: Secondary | ICD-10-CM

## 2016-10-28 DIAGNOSIS — G4733 Obstructive sleep apnea (adult) (pediatric): Secondary | ICD-10-CM

## 2016-10-28 DIAGNOSIS — K219 Gastro-esophageal reflux disease without esophagitis: Secondary | ICD-10-CM

## 2016-10-28 DIAGNOSIS — I10 Essential (primary) hypertension: Secondary | ICD-10-CM

## 2016-10-28 DIAGNOSIS — E782 Mixed hyperlipidemia: Secondary | ICD-10-CM

## 2016-10-28 HISTORY — DX: Obstructive sleep apnea (adult) (pediatric): G47.33

## 2016-10-28 HISTORY — DX: Insomnia, unspecified: G47.00

## 2016-10-28 MED ORDER — TRIAMTERENE-HCTZ 37.5-25 MG PO TABS: 1.0000 | ORAL_TABLET | Freq: Every day | ORAL | 11 refills | Status: DC

## 2016-10-28 MED ORDER — ATORVASTATIN CALCIUM 10 MG PO TABS: 10.0000 mg | ORAL_TABLET | Freq: Every day | ORAL | 11 refills | Status: DC

## 2016-10-28 MED ORDER — METOPROLOL TARTRATE 25 MG PO TABS: 25.0000 mg | ORAL_TABLET | Freq: Two times a day (BID) | ORAL | 11 refills | Status: DC

## 2016-10-28 NOTE — Progress Notes (Signed)
Social Worker Intern was able to conducted a new patient screening tool. Pt expressed feeling stressed due to husbands lack of work and affordability of medications. [facility] offered counseling services and pt expressed maybe in the near future. No follow up was is needed currently.

## 2016-10-28 NOTE — Progress Notes (Signed)
Subjective:    Patient ID: Kiara Sanchez, female    DOB: 05/22/1960, 56 y.o.   MRN: [id number]  HPI   Here to establish  Maxine Glenn, her sister, interprets today.  1. Essential Hypertension: Some of medication is too expensive  2.  CAD:  14 years ago, underwent angioplasty possibly of 7 narrowings with stent placement.  This was at [facility] in [place].  3.  Hyperlipidemia:  Was taking Lovastatin until about 6 months ago, but was having a lot of muscle pain, so stopped.  Was taking 20 mg.    4.  OSA:  Is using CPAP for 8 years.  She is uncertain of pressure settings.  Uses nightly.   5.  Seasonal and Environmental allergies:  TAkes Zyrtec 10 mg daily.  Works well for her.  Has been also using Visine for itchy watery eyes.  6.  GERD:  Uses Pantoprazole:  No elevation HOB.  No smoking, minimal caffeine, little chocolate.  No alcohol.  Does eat tomatoes and onions from time to time.   7.  Burning pain in sternal area radiating to back:  Has had for 7 months.  Seems to be constant.  States whenever she does something at home, she feels tired and gets pain in chest.   Was hospitalized in July for same symptoms. Nuclear stress testing with EF of 55%, ECGs without ischemic changes.  Appears she may have had an echo with Good EF of 66% with normal LV function.    8.  Poor Sleep:  For 8 years.   Goes to bed at 11 p.m. Gets up at 5 a.m.   Breakfast (healthy sounding sandwich) and coffee Lunch:  Combination of starches and vegetables.  Fruit juice with Splenda.   Dinner:  Only fruit.   Walks 1 hour daily:  Walks at 6-7 p.m.Lajean Saver TV before going to bed.  Watching for about 3 hours.  Current Meds  Medication Sig  . aspirin EC 81 MG tablet Take 1 tablet (81 mg total) by mouth daily.  . cetirizine (ZYRTEC) 10 MG tablet Take 10 mg by mouth daily as needed for allergies.  . pantoprazole (PROTONIX) 20 MG tablet Take 2 tablets (40 mg total) by mouth daily.  Marland Kitchen triamterene-hydrochlorothiazide  (MAXZIDE-25) 37.5-25 MG tablet Take 1 tablet by mouth daily.  . [DISCONTINUED] metoprolol succinate (TOPROL-XL) 25 MG 24 hr tablet Take 1 tablet (25 mg total) by mouth daily.  . [DISCONTINUED] PRESCRIPTION MEDICATION Inhale into the lungs at bedtime. CPAP  . [DISCONTINUED] triamterene-hydrochlorothiazide (MAXZIDE-25) 37.5-25 MG tablet Take 1 tablet by mouth daily.    Allergies  Allergen Reactions  . Naproxen     Past Medical History:  Diagnosis Date  . Arthritis    "all over"  . Coronary artery disease   . Family history of adverse reaction to anesthesia    "sister almost stopped breathing"   . GERD (gastroesophageal reflux disease)   . Hypercholesterolemia   . Hypertension   . Insomnia 10/28/2016  . Kidney stones    remote hx/notes 10/25/2008  . OSA (obstructive sleep apnea) 10/28/2016  . OSA on CPAP   . Pneumonia ~ 2014   Past Surgical History:  Procedure Laterality Date  . ABDOMINAL HYSTERECTOMY    . APPENDECTOMY    . CARDIAC CATHETERIZATION  03/2009   Cline Crock 04/23/2009  . CORONARY ANGIOPLASTY WITH STENT PLACEMENT  ~ 2006   [facility] in [place]   . REFRACTIVE SURGERY Bilateral 1990's  . SUPRAVENTRICULAR TACHYCARDIA ABLATION  [facility] in [place]    Family History  Problem Relation Age of Onset  . Diabetes Mother   . Heart disease Mother        Pacemaker placement  . Congestive Heart Failure Mother   . Heart murmur Mother   . Heart attack Father    Social History   Social History  . Marital status: Married    Spouse name: N/A  . Number of children: N/A  . Years of education: N/A   Occupational History  . Not on file.   Social History Main Topics  . Smoking status: Never Smoker  . Smokeless tobacco: Never Used  . Alcohol use Yes     Comment: 10/27/2014 "glass of wine q couple months"  . Drug use: No  . Sexual activity: Yes    Birth control/ protection: Surgical     Comment: unable to ask in traige d/t son with pt and  husband   Other Topics Concern  . Not on file   Social History Narrative   Married with 1 child   Right handed   12 th grade   1 cup daily    Review of Systems     Objective:   Physical Exam  NAD HEENT:  PERRL, EOMI, TMs pearly gray, throat without injection.   Neck:  Supple, No adenopathy, no thyromegaly Chest:  CTA CV:  RRR with normal S1 and S2, NO S3, S4, or murmur.  No carotid bruit.  No JVD.  Carotid, radial and DP pulses normal and equal Abd:  Obese, NT, No HSM or mass, + BS LE:  No edema.        Assessment & Plan:  1.  Essential Hypertension:  Switch to Metoprolol 25 mg twice daily.  2.  CAD:  Relatively recent negative work up for similar chest pain symptoms.  Restart ASA 81 mg daily.  Control BP and cholesterol.  Extended discussion on health eating and regular physical activity  3.  Hyperlipidemia:  Start Atorvastatin 10 mg daily.  Fasting labs in 6 weeks.  FLP, CMP, CBC  4.  Insomnia:  Extended discussion of good sleep hygiene.  She will try to put regular bedtime and wake time into practice.  To read if unable to fall asleep. Encouraged her to follow up with MSW for counseling regarding this as well.  5.  GERD:  Handout and discussion of GERD precautions.  Needs orange card for me to send Rx to [facility].  6.  Allergies:  Zaditor eye drops prn and Zyrtec.

## 2016-10-28 NOTE — Patient Instructions (Addendum)
Enfermedad por reflujo gastroesofgico en los adultos (Gastroesophageal Reflux Disease, Adult) Normalmente, los alimentos descienden por el esfago y se depositan en el estmago para su digestin. Si una persona tiene enfermedad por reflujo gastroesofgico (ERGE), los alimentos y el cido estomacal regresan al esfago. Cuando esto ocurre, el esfago se irrita y se hincha (inflama). Con el tiempo, la ERGE puede provocar la formacin de pequeas perforaciones (lceras) en la mucosa del esfago. CUIDADOS EN EL HOGAR Dieta  Siga la dieta como se lo haya indicado el mdico. Tal vez deba evitar los siguientes alimentos y bebidas: ? Caf y t (con o sin cafena). ? Bebidas que contengan alcohol. ? Bebidas energizantes y deportivas. ? Gaseosas o refrescos. ? Chocolate y cacao. ? Menta y esencias de menta. ? Ajo y cebollas. ? Rbano picante. ? Alimentos muy condimentados y cidos, como pimientos, chile en polvo, curry en polvo, vinagre, salsas picantes y salsa barbacoa. ? Frutas ctricas y sus jugos, como naranjas, limones y limas. ? Alimentos a base de tomates, como salsa roja, chile, salsa y pizza con salsa roja. ? Alimentos fritos y grasos, como rosquillas, papas fritas y aderezos con alto contenido de grasa. ? Carnes con alto contenido de grasa, como hot dogs, filetes de entrecot, salchicha, jamn y tocino. ? Productos lcteos con alto contenido de grasa, como leche entera, mantequilla y queso crema.  Consuma pequeas porciones de comida con ms frecuencia. Evite consumir porciones abundantes.  Evite beber mucho lquido con las comidas.  No coma durante las 2 o 3horas previas a la hora de acostarse.  No se acueste inmediatamente despus de comer.  No haga actividad fsica enseguida despus de comer. Instrucciones generales  Est atento a cualquier cambio en los sntomas.  Tome los medicamentos de venta libre y los recetados solamente como se lo haya indicado el mdico. No tome  aspirina, ibuprofeno ni otros antiinflamatorios no esteroides (AINE), a menos que el mdico lo autorice.  No consuma ningn producto que contenga tabaco, lo que incluye cigarrillos, tabaco de mascar y cigarrillos electrnicos. Si necesita ayuda para dejar de fumar, consulte al mdico.  Use ropa suelta. No use nada ajustado alrededor de la cintura.  Levante (eleve) unas 6pulgadas (15centmetros) la cabecera de la cama.  Intente bajar el nivel de estrs. Si necesita ayuda para hacerlo, consulte al mdico.  Si tiene sobrepeso, adelgace hasta alcanzar un peso saludable. Pregntele a su mdico cmo puede perder peso de manera segura.  Concurra a todas las visitas de control como se lo haya indicado el mdico. Esto es importante. SOLICITE AYUDA SI:  Aparecen nuevos sntomas.  Baja de peso y no sabe por qu.  Tiene dificultad para tragar o siente dolor al hacerlo.  Tiene sibilancias o tos que no desaparece.  Los sntomas no mejoran con el tratamiento.  Tiene la voz ronca. SOLICITE AYUDA DE INMEDIATO SI:  Tiene dolor en los brazos, el cuello, los maxilares, la dentadura o la espalda.  Transpira, se marea o tiene sensacin de desvanecimiento.  Siente falta de aire o dolor en el pecho.  Vomita y el vmito es parecido a la sangre o a los granos de caf.  Pierde el conocimiento (se desmaya).  Las heces son sanguinolentas o de color negro.  No puede tragar, beber o comer. Esta informacin no tiene como fin reemplazar el consejo del mdico. Asegrese de hacerle al mdico cualquier pregunta que tenga. Document Released: 02/19/2010 Document Revised: 10/08/2014 Document Reviewed: 05/14/2014 Elsevier Interactive Patient Education  2018 [employer]vier Inc.  

## 2016-12-09 ENCOUNTER — Other Ambulatory Visit: Payer: Self-pay

## 2016-12-09 DIAGNOSIS — E782 Mixed hyperlipidemia: Secondary | ICD-10-CM

## 2016-12-09 DIAGNOSIS — Z79899 Other long term (current) drug therapy: Secondary | ICD-10-CM

## 2016-12-10 LAB — CBC WITH DIFFERENTIAL/PLATELET
BASOS ABS: 0.1 10*3/uL (ref 0.0–0.2)
Basos: 1 %
EOS (ABSOLUTE): 0.2 10*3/uL (ref 0.0–0.4)
EOS: 3 %
HEMOGLOBIN: 14.4 g/dL (ref 11.1–15.9)
Hematocrit: 43.9 % (ref 34.0–46.6)
IMMATURE GRANS (ABS): 0 10*3/uL (ref 0.0–0.1)
IMMATURE GRANULOCYTES: 0 %
LYMPHS: 37 %
Lymphocytes Absolute: 2.1 10*3/uL (ref 0.7–3.1)
MCH: 29.6 pg (ref 26.6–33.0)
MCHC: 32.8 g/dL (ref 31.5–35.7)
MCV: 90 fL (ref 79–97)
MONOCYTES: 7 %
Monocytes Absolute: 0.4 10*3/uL (ref 0.1–0.9)
NEUTROS PCT: 52 %
Neutrophils Absolute: 3 10*3/uL (ref 1.4–7.0)
Platelets: 344 10*3/uL (ref 150–379)
RBC: 4.87 x10E6/uL (ref 3.77–5.28)
RDW: 13.3 % (ref 12.3–15.4)
WBC: 5.8 10*3/uL (ref 3.4–10.8)

## 2016-12-10 LAB — LIPID PANEL W/O CHOL/HDL RATIO
Cholesterol, Total: 171 mg/dL (ref 100–199)
HDL: 40 mg/dL (ref >39)
LDL CALC: 94 mg/dL (ref 0–99)
Triglycerides: 184 mg/dL — ABNORMAL HIGH (ref 0–149)
VLDL CHOLESTEROL CAL: 37 mg/dL (ref 5–40)

## 2016-12-10 LAB — COMPREHENSIVE METABOLIC PANEL
ALK PHOS: 77 IU/L (ref 39–117)
ALT: 26 IU/L (ref 0–32)
AST: 21 IU/L (ref 0–40)
Albumin/Globulin Ratio: 1.2 (ref 1.2–2.2)
Albumin: 4.3 g/dL (ref 3.5–5.5)
BILIRUBIN TOTAL: 0.3 mg/dL (ref 0.0–1.2)
BUN/Creatinine Ratio: 23 (ref 9–23)
BUN: 16 mg/dL (ref 6–24)
CHLORIDE: 101 mmol/L (ref 96–106)
CO2: 26 mmol/L (ref 20–29)
CREATININE: 0.69 mg/dL (ref 0.57–1.00)
Calcium: 9.4 mg/dL (ref 8.7–10.2)
GFR calc Af Amer: 113 mL/min/{1.73_m2} (ref >59)
GFR calc non Af Amer: 98 mL/min/{1.73_m2} (ref >59)
GLUCOSE: 86 mg/dL (ref 65–99)
Globulin, Total: 3.6 g/dL (ref 1.5–4.5)
Potassium: 4.3 mmol/L (ref 3.5–5.2)
Sodium: 141 mmol/L (ref 134–144)
Total Protein: 7.9 g/dL (ref 6.0–8.5)

## 2017-01-01 ENCOUNTER — Other Ambulatory Visit: Payer: Self-pay | Admitting: Internal Medicine

## 2017-01-01 MED ORDER — ATORVASTATIN CALCIUM 20 MG PO TABS: ORAL_TABLET | ORAL | 11 refills | Status: DC

## 2017-01-01 NOTE — Progress Notes (Signed)
LDL not at goal of less than 70, increase Atrovastatin to 20 mg and repeat labs in 6 weeks.

## 2017-02-16 ENCOUNTER — Other Ambulatory Visit: Payer: Self-pay

## 2017-02-22 ENCOUNTER — Other Ambulatory Visit: Payer: Self-pay

## 2017-02-22 DIAGNOSIS — Z79899 Other long term (current) drug therapy: Secondary | ICD-10-CM

## 2017-02-22 DIAGNOSIS — E782 Mixed hyperlipidemia: Secondary | ICD-10-CM

## 2017-02-23 LAB — LIPID PANEL W/O CHOL/HDL RATIO
CHOLESTEROL TOTAL: 171 mg/dL (ref 100–199)
HDL: 44 mg/dL (ref >39)
LDL Calculated: 87 mg/dL (ref 0–99)
Triglycerides: 198 mg/dL — ABNORMAL HIGH (ref 0–149)
VLDL Cholesterol Cal: 40 mg/dL (ref 5–40)

## 2017-02-23 LAB — HEPATIC FUNCTION PANEL
ALK PHOS: 70 IU/L (ref 39–117)
ALT: 24 IU/L (ref 0–32)
AST: 21 IU/L (ref 0–40)
Albumin: 4.5 g/dL (ref 3.5–5.5)
BILIRUBIN, DIRECT: 0.11 mg/dL (ref 0.00–0.40)
Bilirubin Total: 0.4 mg/dL (ref 0.0–1.2)
Total Protein: 8 g/dL (ref 6.0–8.5)

## 2017-03-09 ENCOUNTER — Other Ambulatory Visit: Payer: Self-pay | Admitting: Internal Medicine

## 2017-03-09 MED ORDER — ATORVASTATIN CALCIUM 40 MG PO TABS: ORAL_TABLET | ORAL | 11 refills | Status: AC

## 2017-03-09 NOTE — Progress Notes (Signed)
Increasing Atorvastatin to 40 mg

## 2017-04-20 ENCOUNTER — Other Ambulatory Visit: Payer: Self-pay

## 2017-04-20 DIAGNOSIS — E782 Mixed hyperlipidemia: Secondary | ICD-10-CM

## 2017-04-20 DIAGNOSIS — Z79899 Other long term (current) drug therapy: Secondary | ICD-10-CM

## 2017-04-21 LAB — HEPATIC FUNCTION PANEL
ALBUMIN: 4.3 g/dL (ref 3.5–5.5)
ALT: 31 IU/L (ref 0–32)
AST: 24 IU/L (ref 0–40)
Alkaline Phosphatase: 70 IU/L (ref 39–117)
Bilirubin Total: 0.3 mg/dL (ref 0.0–1.2)
Bilirubin, Direct: 0.09 mg/dL (ref 0.00–0.40)
Total Protein: 7.5 g/dL (ref 6.0–8.5)

## 2017-04-21 LAB — LIPID PANEL W/O CHOL/HDL RATIO
Cholesterol, Total: 182 mg/dL (ref 100–199)
HDL: 46 mg/dL (ref >39)
LDL Calculated: 77 mg/dL (ref 0–99)
TRIGLYCERIDES: 294 mg/dL — AB (ref 0–149)
VLDL Cholesterol Cal: 59 mg/dL — ABNORMAL HIGH (ref 5–40)

## 2017-05-21 ENCOUNTER — Emergency Department (HOSPITAL_COMMUNITY)
Admission: EM | Admit: 2017-05-21 | Discharge: 2017-05-22 | Disposition: A | Discharge status: Home / Self Care | Payer: Self-pay | Attending: Emergency Medicine | Admitting: Emergency Medicine

## 2017-05-21 ENCOUNTER — Encounter (HOSPITAL_COMMUNITY): Payer: Self-pay | Admitting: Emergency Medicine

## 2017-05-21 ENCOUNTER — Emergency Department (HOSPITAL_COMMUNITY): Payer: Self-pay

## 2017-05-21 ENCOUNTER — Other Ambulatory Visit: Payer: Self-pay

## 2017-05-21 DIAGNOSIS — R0602 Shortness of breath: Secondary | ICD-10-CM | POA: Insufficient documentation

## 2017-05-21 DIAGNOSIS — Z5321 Procedure and treatment not carried out due to patient leaving prior to being seen by health care provider: Secondary | ICD-10-CM | POA: Insufficient documentation

## 2017-05-21 DIAGNOSIS — R079 Chest pain, unspecified: Secondary | ICD-10-CM | POA: Insufficient documentation

## 2017-05-21 LAB — CBC
HCT: 41.9 % (ref 36.0–46.0)
Hemoglobin: 13.6 g/dL (ref 12.0–15.0)
MCH: 30.1 pg (ref 26.0–34.0)
MCHC: 32.5 g/dL (ref 30.0–36.0)
MCV: 92.7 fL (ref 78.0–100.0)
PLATELETS: 315 10*3/uL (ref 150–400)
RBC: 4.52 MIL/uL (ref 3.87–5.11)
RDW: 13.8 % (ref 11.5–15.5)
WBC: 6.9 10*3/uL (ref 4.0–10.5)

## 2017-05-21 LAB — I-STAT TROPONIN, ED: TROPONIN I, POC: 0 ng/mL (ref 0.00–0.08)

## 2017-05-21 LAB — BASIC METABOLIC PANEL
ANION GAP: 14 (ref 5–15)
BUN: 15 mg/dL (ref 6–20)
CALCIUM: 8.9 mg/dL (ref 8.9–10.3)
CHLORIDE: 106 mmol/L (ref 101–111)
CO2: 20 mmol/L — ABNORMAL LOW (ref 22–32)
CREATININE: 0.65 mg/dL (ref 0.44–1.00)
GFR calc Af Amer: >60 mL/min (ref >60)
GFR calc non Af Amer: >60 mL/min (ref >60)
GLUCOSE: 124 mg/dL — AB (ref 65–99)
Potassium: 3.5 mmol/L (ref 3.5–5.1)
Sodium: 140 mmol/L (ref 135–145)

## 2017-05-21 LAB — I-STAT BETA HCG BLOOD, ED (MC, WL, AP ONLY): I-stat hCG, quantitative: <5 m[IU]/mL (ref <5)

## 2017-05-21 NOTE — ED Triage Notes (Signed)
Reports generalized chest pain x 5 days with burning radiating pain to back.  Reports sob since waking up this morning and squeezing pain to diaphragm area.

## 2017-05-22 NOTE — ED Notes (Signed)
Pt left complained about wait time. Explained about wait time .

## 2017-05-23 ENCOUNTER — Encounter: Payer: Self-pay | Admitting: Internal Medicine

## 2017-07-21 ENCOUNTER — Encounter: Payer: Self-pay | Admitting: Internal Medicine

## 2017-10-09 ENCOUNTER — Encounter (HOSPITAL_COMMUNITY): Payer: Self-pay | Admitting: Emergency Medicine

## 2017-10-09 ENCOUNTER — Emergency Department (HOSPITAL_COMMUNITY): Payer: Self-pay

## 2017-10-09 ENCOUNTER — Emergency Department (HOSPITAL_COMMUNITY)
Admission: EM | Admit: 2017-10-09 | Discharge: 2017-10-10 | Disposition: A | Discharge status: Home / Self Care | Payer: Self-pay | Attending: Emergency Medicine | Admitting: Emergency Medicine

## 2017-10-09 DIAGNOSIS — Z7982 Long term (current) use of aspirin: Secondary | ICD-10-CM | POA: Insufficient documentation

## 2017-10-09 DIAGNOSIS — R42 Dizziness and giddiness: Secondary | ICD-10-CM

## 2017-10-09 DIAGNOSIS — H61399 Other acquired stenosis of external ear canal, unspecified ear: Secondary | ICD-10-CM | POA: Insufficient documentation

## 2017-10-09 DIAGNOSIS — I10 Essential (primary) hypertension: Secondary | ICD-10-CM | POA: Insufficient documentation

## 2017-10-09 DIAGNOSIS — I2510 Atherosclerotic heart disease of native coronary artery without angina pectoris: Secondary | ICD-10-CM | POA: Insufficient documentation

## 2017-10-09 DIAGNOSIS — Z79899 Other long term (current) drug therapy: Secondary | ICD-10-CM | POA: Insufficient documentation

## 2017-10-09 LAB — URINALYSIS, ROUTINE W REFLEX MICROSCOPIC
Bilirubin Urine: NEGATIVE
GLUCOSE, UA: NEGATIVE mg/dL
Hgb urine dipstick: NEGATIVE
Ketones, ur: NEGATIVE mg/dL
LEUKOCYTES UA: NEGATIVE
Nitrite: NEGATIVE
PH: 7 (ref 5.0–8.0)
Protein, ur: NEGATIVE mg/dL
SPECIFIC GRAVITY, URINE: 1.017 (ref 1.005–1.030)

## 2017-10-09 LAB — DIFFERENTIAL
Abs Immature Granulocytes: 0 K/uL (ref 0.0–0.1)
Basophils Absolute: 0.1 K/uL (ref 0.0–0.1)
Basophils Relative: 1 %
Eosinophils Absolute: 0.1 K/uL (ref 0.0–0.7)
Eosinophils Relative: 2 %
Immature Granulocytes: 0 %
Lymphocytes Relative: 41 %
Lymphs Abs: 2.8 K/uL (ref 0.7–4.0)
Monocytes Absolute: 0.7 K/uL (ref 0.1–1.0)
Monocytes Relative: 11 %
Neutro Abs: 3.1 K/uL (ref 1.7–7.7)
Neutrophils Relative %: 45 %

## 2017-10-09 LAB — I-STAT CHEM 8, ED
BUN: 13 mg/dL (ref 6–20)
Calcium, Ion: 1.09 mmol/L — ABNORMAL LOW (ref 1.15–1.40)
Chloride: 106 mmol/L (ref 98–111)
Creatinine, Ser: 0.6 mg/dL (ref 0.44–1.00)
Glucose, Bld: 82 mg/dL (ref 70–99)
HCT: 44 % (ref 36.0–46.0)
Hemoglobin: 15 g/dL (ref 12.0–15.0)
Potassium: 3.3 mmol/L — ABNORMAL LOW (ref 3.5–5.1)
Sodium: 141 mmol/L (ref 135–145)
TCO2: 23 mmol/L (ref 22–32)

## 2017-10-09 LAB — COMPREHENSIVE METABOLIC PANEL
ALBUMIN: 3.9 g/dL (ref 3.5–5.0)
ALT: 31 U/L (ref 0–44)
ANION GAP: 9 (ref 5–15)
AST: 24 U/L (ref 15–41)
Alkaline Phosphatase: 57 U/L (ref 38–126)
BILIRUBIN TOTAL: 0.4 mg/dL (ref 0.3–1.2)
BUN: 12 mg/dL (ref 6–20)
CHLORIDE: 107 mmol/L (ref 98–111)
CO2: 25 mmol/L (ref 22–32)
Calcium: 9.4 mg/dL (ref 8.9–10.3)
Creatinine, Ser: 0.65 mg/dL (ref 0.44–1.00)
GFR calc Af Amer: >60 mL/min (ref >60)
GLUCOSE: 80 mg/dL (ref 70–99)
POTASSIUM: 3.6 mmol/L (ref 3.5–5.1)
Sodium: 141 mmol/L (ref 135–145)
TOTAL PROTEIN: 7.7 g/dL (ref 6.5–8.1)

## 2017-10-09 LAB — CBC
HEMATOCRIT: 44.6 % (ref 36.0–46.0)
HEMOGLOBIN: 14.2 g/dL (ref 12.0–15.0)
MCH: 29.5 pg (ref 26.0–34.0)
MCHC: 31.8 g/dL (ref 30.0–36.0)
MCV: 92.5 fL (ref 78.0–100.0)
Platelets: 362 10*3/uL (ref 150–400)
RBC: 4.82 MIL/uL (ref 3.87–5.11)
RDW: 13.5 % (ref 11.5–15.5)
WBC: 6.6 10*3/uL (ref 4.0–10.5)

## 2017-10-09 LAB — APTT: aPTT: 30 seconds (ref 24–36)

## 2017-10-09 LAB — I-STAT BETA HCG BLOOD, ED (MC, WL, AP ONLY): I-stat hCG, quantitative: <5 m[IU]/mL (ref <5)

## 2017-10-09 LAB — PROTIME-INR
INR: 0.95
Prothrombin Time: 12.6 seconds (ref 11.4–15.2)

## 2017-10-09 LAB — LIPASE, BLOOD: LIPASE: 35 U/L (ref 11–51)

## 2017-10-09 LAB — I-STAT TROPONIN, ED: Troponin i, poc: 0.03 ng/mL (ref 0.00–0.08)

## 2017-10-09 MED ORDER — ONDANSETRON 4 MG PO TBDP
4.0000 mg | ORAL_TABLET | Freq: Once | ORAL | Status: AC | PRN
  Administered 2017-10-09: 4 mg via ORAL
  Filled 2017-10-09: qty 1

## 2017-10-09 NOTE — ED Triage Notes (Signed)
Pt presents with [facility] for dizziness that began yesterday, abd pain with nausea and weakness today; EMS report she was also hyperventilating at 30 resp with tingling hands/feet; pt speaks limited english

## 2017-10-10 MED ORDER — MECLIZINE HCL 25 MG PO TABS
50.0000 mg | ORAL_TABLET | Freq: Once | ORAL | Status: AC
  Administered 2017-10-10: 50 mg via ORAL
  Filled 2017-10-10: qty 2

## 2017-10-10 MED ORDER — MECLIZINE HCL 25 MG PO TABS: 25.0000 mg | ORAL_TABLET | Freq: Three times a day (TID) | ORAL | 0 refills | Status: AC | PRN

## 2017-10-10 NOTE — ED Notes (Signed)
PT states understanding of care given, follow up care, and medication prescribed. PT ambulated from ED to car with a steady gait. 

## 2017-10-10 NOTE — Discharge Instructions (Addendum)
See your doctor in 2-3 days for recheck of symptoms of vertigo. Return here if symptoms worsen. Take Meclizine as prescribed.

## 2017-10-10 NOTE — ED Provider Notes (Signed)
[facility] EMERGENCY DEPARTMENT Provider Note   CSN: [id number] Arrival date & time: 10/09/17  1535     History   Chief Complaint Chief Complaint  Patient presents with  . Dizziness  . Abdominal Pain  . Weakness    HPI Kiara Sanchez is a 57 y.o. female.  Patient with a history of HTN, HLD, OSA, GERD, kidney stones, CAD presents with room-spinning dizziness that started 3 days ago. No history of same. She has nausea and initially had abdominal pain but reports the pain has resolved. Moving her head, change in position makes the dizziness worse. No fever. No recent URI symptoms. No recent medication changes. She reports generalized weakness but no weakness that is lateralizing. No falls but she feels off balance when she walks.   The history is provided by the patient and the spouse.    Past Medical History:  Diagnosis Date  . Arthritis    "all over"  . Coronary artery disease   . Family history of adverse reaction to anesthesia    "sister almost stopped breathing"   . GERD (gastroesophageal reflux disease)   . Hypercholesterolemia   . Hypertension   . Insomnia 10/28/2016  . Kidney stones    remote hx/notes 10/25/2008  . OSA (obstructive sleep apnea) 10/28/2016  . OSA on CPAP   . Pneumonia ~ 2014    Patient Active Problem List   Diagnosis Date Noted  . OSA (obstructive sleep apnea) 10/28/2016  . Insomnia 10/28/2016  . Chest pain 10/27/2014  . Hypokalemia 10/27/2014  . Fever 10/27/2014  . Depression 10/27/2014  . 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 Anxiety   . GERD (gastroesophageal reflux disease)   . Cough   . Essential hypertension 07/31/2013  . Hyperlipidemia 07/31/2013  . Palpitations 07/31/2013  . Paresthesias 06/27/2013    Past Surgical History:  Procedure Laterality Date  . ABDOMINAL HYSTERECTOMY    . APPENDECTOMY    . CARDIAC CATHETERIZATION  03/2009   Cline Crock 04/23/2009  . CORONARY ANGIOPLASTY WITH  STENT PLACEMENT  ~ 2006   [employer] in [place]   . REFRACTIVE SURGERY Bilateral 1990's  . SUPRAVENTRICULAR TACHYCARDIA ABLATION     [employer] in [place]      [place] History   None      Home Medications    Prior to Admission medications   Medication Sig Start Date End Date Taking? Authorizing Provider  aspirin EC 81 MG tablet Take 1 tablet (81 mg total) by mouth daily. 08/04/16   Kathlen Mody, MD  atorvastatin (LIPITOR) 40 MG tablet 1 tab by mouth daily with evening meal 03/09/17   Julieanne Manson, MD  cetirizine (ZYRTEC) 10 MG tablet Take 10 mg by mouth daily as needed for allergies.    [provider]  COLLAGEN PO Take 1 capsule by mouth daily. For muscle pain    [provider]  Ketotifen Fumarate (ALLERGY EYE DROPS OP) Place 1 drop into both eyes daily as needed (itching/allergies).    [provider]  metoprolol tartrate (LOPRESSOR) 25 MG tablet Take 1 tablet (25 mg total) by mouth 2 (two) times daily. 10/28/16   Julieanne Manson, MD  pantoprazole (PROTONIX) 20 MG tablet Take 2 tablets (40 mg total) by mouth daily. 08/04/16   Kathlen Mody, MD  triamterene-hydrochlorothiazide (MAXZIDE-25) 37.5-25 MG tablet Take 1 tablet by mouth daily. 10/28/16   Julieanne Manson, MD    Family History Family History  Problem Relation Age of  Onset  . Diabetes Mother   . Heart disease Mother        Pacemaker placement  . Congestive Heart Failure Mother   . Heart murmur Mother   . Heart attack Father     Social History Social History   Tobacco Use  . Smoking status: Never Smoker  . Smokeless tobacco: Never Used  Substance Use Topics  . Alcohol use: Yes    Comment: 10/27/2014 "glass of wine q couple months"  . Drug use: No     Allergies   Naproxen   Review of Systems Review of Systems  Constitutional: Negative for chills and fever.  HENT: Negative.  Negative for congestion.   Eyes: Negative for photophobia and visual  disturbance.  Respiratory: Negative.  Negative for cough and shortness of breath.   Cardiovascular: Negative.  Negative for chest pain.  Gastrointestinal: Positive for abdominal pain (See HPI.) and nausea.  Genitourinary: Positive for frequency (Reports urinary frequency at night - ongoing, not a new symptom). Negative for dysuria.  Musculoskeletal: Negative.  Negative for myalgias.  Skin: Negative.   Neurological: Positive for dizziness and weakness. Negative for syncope, speech difficulty and numbness.     Physical Exam Updated Vital Signs BP (!) 126/58   Pulse 74   Temp 97.9 F (36.6 C) (Oral)   Resp 20   LMP 12/24/2001   SpO2 94%   Physical Exam  Constitutional: She is oriented to person, place, and time. She appears well-developed and well-nourished.  HENT:  Head: Normocephalic.  Neck: Normal range of motion. Neck supple. Carotid bruit is not present.  Cardiovascular: Normal rate and regular rhythm.  No murmur heard. Pulmonary/Chest: Effort normal and breath sounds normal. She has no wheezes. She has no rales. She exhibits no tenderness.  Abdominal: Soft. Bowel sounds are normal. There is no tenderness. There is no rebound and no guarding.  Musculoskeletal: Normal range of motion. She exhibits no edema.  Neurological: She is alert and oriented to person, place, and time.  CN's 3-12 intact. Lateral nystagmus is present. No deficits in coordination. Speech clear and focused. No sensory deficits. Equal and symmetric strength in UE's and LE's.   Skin: Skin is warm and dry. No rash noted.  Psychiatric: She has a normal mood and affect.     ED Treatments / Results  Labs (all labs ordered are listed, but only abnormal results are displayed) Labs Reviewed  I-STAT CHEM 8, ED - Abnormal; Notable for the following components:      Result Value   Potassium 3.3 (*)    Calcium, Ion 1.09 (*)    All other components within normal limits  LIPASE, BLOOD  COMPREHENSIVE METABOLIC  PANEL  CBC  URINALYSIS, ROUTINE W REFLEX MICROSCOPIC  PROTIME-INR  APTT  DIFFERENTIAL  I-STAT BETA HCG BLOOD, ED ([facility], [facility], [facility] ONLY)  I-STAT TROPONIN, ED  CBG MONITORING, ED  I-STAT BETA HCG BLOOD, ED ([facility], [facility], [facility] ONLY)    EKG None  Radiology Ct Head Wo Contrast  Result Date: 10/09/2017 CLINICAL DATA:  Dizziness since yesterday, abdominal pain, nausea, and weakness today, hyperventilation, tingling in hands and feet EXAM: CT HEAD WITHOUT CONTRAST TECHNIQUE: Contiguous axial images were obtained from the base of the skull through the vertex without intravenous contrast. Sagittal and coronal MPR images reconstructed from axial data set. COMPARISON:  01/13/2013 FINDINGS: Brain: Normal ventricular morphology. No midline shift or mass effect. Normal appearance of brain parenchyma. No intracranial hemorrhage, mass lesion or evidence of acute infarction. No  extra-axial fluid collections. Vascular: Unremarkable Skull: Intact Sinuses/Orbits: Clear Other: N/A IMPRESSION: Normal exam. Electronically Signed   By: Ulyses Southward M.D.   On: 10/09/2017 19:18    Procedures Procedures (including critical care time)  Medications Ordered in ED Medications  meclizine (ANTIVERT) tablet 50 mg (has no administration in time range)  ondansetron (ZOFRAN-ODT) disintegrating tablet 4 mg (4 mg Oral Given 10/09/17 1556)     Initial Impression / Assessment and Plan / ED Course  I have reviewed the triage vital signs and the nursing notes.  Pertinent labs & imaging results that were available during my care of the patient were reviewed by me and considered in my medical decision making (see chart for details).     Patient is here with dizziness as if her surroundings are spinning, worse with position changes. No history of same.   Suspect benign positional vertigo. Meclizine ordered. Will reassess.   Patient feels much improved with Meclizine. Still mild residual dizziness when brought to sitting up position,  however, much better. Feel her symptoms represent benign positional vertigo.   Final Clinical Impressions(s) / ED Diagnoses   Final diagnoses:  None   1. Benign positional vertigo  ED Discharge Orders    None       Elpidio Anis, Cordelia Poche 10/10/17 1610    Dione Booze, MD 10/10/17 [phone]

## 2017-10-22 ENCOUNTER — Telehealth: Payer: Self-pay | Admitting: Licensed Clinical Social Worker

## 2017-10-29 ENCOUNTER — Other Ambulatory Visit: Payer: Self-pay | Admitting: Internal Medicine

## 2017-12-16 ENCOUNTER — Other Ambulatory Visit: Payer: Self-pay | Admitting: Internal Medicine

## 2017-12-23 ENCOUNTER — Other Ambulatory Visit: Payer: Self-pay | Admitting: Internal Medicine

## 2018-01-01 ENCOUNTER — Ambulatory Visit: Payer: Self-pay | Admitting: Internal Medicine

## 2018-01-01 ENCOUNTER — Encounter: Payer: Self-pay | Admitting: Internal Medicine

## 2019-01-23 ENCOUNTER — Encounter (HOSPITAL_BASED_OUTPATIENT_CLINIC_OR_DEPARTMENT_OTHER): Payer: Self-pay

## 2019-01-23 ENCOUNTER — Other Ambulatory Visit: Payer: Self-pay

## 2019-01-23 ENCOUNTER — Emergency Department (HOSPITAL_BASED_OUTPATIENT_CLINIC_OR_DEPARTMENT_OTHER)
Admission: EM | Admit: 2019-01-23 | Discharge: 2019-01-23 | Disposition: A | Discharge status: Home / Self Care | Payer: BLUE CROSS/BLUE SHIELD | Attending: Emergency Medicine | Admitting: Emergency Medicine

## 2019-01-23 ENCOUNTER — Emergency Department (HOSPITAL_BASED_OUTPATIENT_CLINIC_OR_DEPARTMENT_OTHER): Payer: BLUE CROSS/BLUE SHIELD

## 2019-01-23 DIAGNOSIS — R0789 Other chest pain: Secondary | ICD-10-CM | POA: Insufficient documentation

## 2019-01-23 DIAGNOSIS — I2510 Atherosclerotic heart disease of native coronary artery without angina pectoris: Secondary | ICD-10-CM | POA: Diagnosis not present

## 2019-01-23 DIAGNOSIS — Z20828 Contact with and (suspected) exposure to other viral communicable diseases: Secondary | ICD-10-CM | POA: Diagnosis not present

## 2019-01-23 DIAGNOSIS — I10 Essential (primary) hypertension: Secondary | ICD-10-CM | POA: Diagnosis not present

## 2019-01-23 DIAGNOSIS — Z7982 Long term (current) use of aspirin: Secondary | ICD-10-CM | POA: Insufficient documentation

## 2019-01-23 DIAGNOSIS — R079 Chest pain, unspecified: Secondary | ICD-10-CM | POA: Diagnosis present

## 2019-01-23 DIAGNOSIS — Z79899 Other long term (current) drug therapy: Secondary | ICD-10-CM | POA: Diagnosis not present

## 2019-01-23 LAB — D-DIMER, QUANTITATIVE (NOT AT ARMC): D-Dimer, Quant: <0.27 ug/mL-FEU (ref 0.00–0.50)

## 2019-01-23 LAB — BASIC METABOLIC PANEL
Anion gap: 7 (ref 5–15)
BUN: 16 mg/dL (ref 6–20)
CO2: 25 mmol/L (ref 22–32)
Calcium: 9.1 mg/dL (ref 8.9–10.3)
Chloride: 108 mmol/L (ref 98–111)
Creatinine, Ser: 0.59 mg/dL (ref 0.44–1.00)
GFR calc Af Amer: >60 mL/min (ref >60)
GFR calc non Af Amer: >60 mL/min (ref >60)
Glucose, Bld: 88 mg/dL (ref 70–99)
Potassium: 3.5 mmol/L (ref 3.5–5.1)
Sodium: 140 mmol/L (ref 135–145)

## 2019-01-23 LAB — CBC
HCT: 44.7 % (ref 36.0–46.0)
Hemoglobin: 14.5 g/dL (ref 12.0–15.0)
MCH: 30.1 pg (ref 26.0–34.0)
MCHC: 32.4 g/dL (ref 30.0–36.0)
MCV: 92.9 fL (ref 80.0–100.0)
Platelets: 297 10*3/uL (ref 150–400)
RBC: 4.81 MIL/uL (ref 3.87–5.11)
RDW: 13.2 % (ref 11.5–15.5)
WBC: 6.1 10*3/uL (ref 4.0–10.5)
nRBC: 0 % (ref 0.0–0.2)

## 2019-01-23 LAB — SARS CORONAVIRUS 2 AG (30 MIN TAT): SARS Coronavirus 2 Ag: NEGATIVE

## 2019-01-23 LAB — CBG MONITORING, ED: Glucose-Capillary: 80 mg/dL (ref 70–99)

## 2019-01-23 LAB — TROPONIN I (HIGH SENSITIVITY)
Troponin I (High Sensitivity): 8 ng/L (ref <18)
Troponin I (High Sensitivity): 11 ng/L (ref <18)

## 2019-01-23 MED ORDER — ASPIRIN 81 MG PO CHEW
324.0000 mg | CHEWABLE_TABLET | Freq: Once | ORAL | Status: DC
  Filled 2019-01-23: qty 4

## 2019-01-23 NOTE — ED Triage Notes (Signed)
Son at bedside reports that pt was seen today at [facility] ([facility]) for pain to left chest, SOB, and pain in her left arm.   Using interpreter pt reports that she has had this CP and SOB. Pt reports that the pain started around 2 am this morning.

## 2019-01-23 NOTE — ED Provider Notes (Signed)
[facility] EMERGENCY DEPARTMENT Provider Note   CSN: [id number] Arrival date & time: 01/23/19  1101     History Chief Complaint  Patient presents with  . Chest Pain  . Shortness of Breath    Kiara HubertMaria Sanchez is a 58 y.o. female.  HPI   58 year old female, with a PMH of CAD with multiple stents, HTN, HLD, DVT many years ago, presents with chest pain.  Patient states pain started suddenly last night on the left side of her chest radiating to her left arm.  She notes associated shortness of breath.  Patient describes the pain as a pressure, worse with taking a deep breath.  She denies any other aggravating or alleviating factors of her symptoms.  She denies any associated fevers, chills, cough, sputum production.  She denies any nausea, vomiting.  Patient denies any recent travel/trauma/immobilization, peripheral edema.  She states her DVT was approximately 4 years ago due to a long car ride.  She is no longer on any anticoagulation.  Past Medical History:  Diagnosis Date  . Arthritis    "all over"  . Coronary artery disease   . Family history of adverse reaction to anesthesia    "sister almost stopped breathing"   . GERD (gastroesophageal reflux disease)   . Hypercholesterolemia   . Hypertension   . Insomnia 10/28/2016  . Kidney stones    remote hx/notes 10/25/2008  . OSA (obstructive sleep apnea) 10/28/2016  . OSA on CPAP   . Pneumonia ~ 2014    Patient Active Problem List   Diagnosis Date Noted  . OSA (obstructive sleep apnea) 10/28/2016  . Insomnia 10/28/2016  . Chest pain 10/27/2014  . Hypokalemia 10/27/2014  . Fever 10/27/2014  . Depression 10/27/2014  . 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 Anxiety   . GERD (gastroesophageal reflux disease)   . Cough   . Essential hypertension 07/31/2013  . Hyperlipidemia 07/31/2013  . Palpitations 07/31/2013  . Paresthesias 06/27/2013    Past Surgical History:  Procedure Laterality Date   . ABDOMINAL HYSTERECTOMY    . APPENDECTOMY    . CARDIAC CATHETERIZATION  03/2009   Cline Crock/notesw 04/23/2009  . CORONARY ANGIOPLASTY WITH STENT PLACEMENT  ~ 2006   [employer] in [place]   . REFRACTIVE SURGERY Bilateral 1990's  . SUPRAVENTRICULAR TACHYCARDIA ABLATION     [employer] [place]      [place] History   No obstetric history on file.     Family History  Problem Relation Age of Onset  . Diabetes Mother   . Heart disease Mother        Pacemaker placement  . Congestive Heart Failure Mother   . Heart murmur Mother   . Heart attack Father     Social History   Tobacco Use  . Smoking status: Never Smoker  . Smokeless tobacco: Never Used  Substance Use Topics  . Alcohol use: Yes    Comment: 10/27/2014 "glass of wine q couple months"  . Drug use: No    Home Medications Prior to Admission medications   Medication Sig Start Date End Date Taking? Authorizing Provider  aspirin EC 81 MG tablet Take 1 tablet (81 mg total) by mouth daily. 08/04/16   Kathlen ModyAkula, Vijaya, MD  atorvastatin (LIPITOR) 40 MG tablet 1 tab by mouth daily with evening meal 03/09/17   Julieanne MansonMulberry, Elizabeth, MD  cetirizine (ZYRTEC) 10 MG tablet Take 10 mg by mouth daily as needed for allergies.    [provider]  COLLAGEN PO Take 1 capsule by mouth daily. For muscle pain    [provider]  Ketotifen Fumarate (ALLERGY EYE DROPS OP) Place 1 drop into both eyes daily as needed (itching/allergies).    [provider]  meclizine (ANTIVERT) 25 MG tablet Take 1 tablet (25 mg total) by mouth 3 (three) times daily as needed for dizziness. 10/10/17   Elpidio Anis, PA-C  metoprolol tartrate (LOPRESSOR) 25 MG tablet TAKE 1 TABLET BY MOUTH TWICE DAILY 10/30/17   Julieanne Manson, MD  pantoprazole (PROTONIX) 20 MG tablet Take 2 tablets (40 mg total) by mouth daily. 08/04/16   Kathlen Mody, MD  triamterene-hydrochlorothiazide (MAXZIDE-25) 37.5-25 MG tablet TAKE 1 TABLET BY MOUTH ONCE  DAILY 10/30/17   Julieanne Manson, MD    Allergies    Naproxen  Review of Systems   Review of Systems  Constitutional: Negative for chills and fever.  HENT: Negative for rhinorrhea and sore throat.   Eyes: Negative for visual disturbance.  Respiratory: Positive for shortness of breath. Negative for cough.   Cardiovascular: Positive for chest pain. Negative for leg swelling.  Gastrointestinal: Negative for abdominal pain, diarrhea, nausea and vomiting.  Genitourinary: Negative for dysuria, frequency and urgency.  Musculoskeletal: Negative for joint swelling and neck pain.  Skin: Negative for rash and wound.  Neurological: Negative for syncope and numbness.  All other systems reviewed and are negative.   Physical Exam Updated Vital Signs BP (!) 154/79   Pulse 71   Temp 97.7 F (36.5 C) (Oral)   Resp 18   Wt 80 kg   LMP 12/24/2001   SpO2 99%   BMI 34.45 kg/m   Physical Exam Vitals and nursing note reviewed.  Constitutional:      Appearance: She is well-developed.  HENT:     Head: Normocephalic and atraumatic.  Eyes:     Conjunctiva/sclera: Conjunctivae normal.  Cardiovascular:     Rate and Rhythm: Normal rate and regular rhythm.     Heart sounds: Normal heart sounds. No murmur.  Pulmonary:     Effort: Pulmonary effort is normal. No respiratory distress.     Breath sounds: Normal breath sounds. No wheezing or rales.  Abdominal:     General: Bowel sounds are normal. There is no distension.     Palpations: Abdomen is soft.     Tenderness: There is no abdominal tenderness.  Musculoskeletal:        General: No tenderness or deformity. Normal range of motion.     Cervical back: Neck supple.  Skin:    General: Skin is warm and dry.     Findings: No erythema or rash.  Neurological:     Mental Status: She is alert and oriented to person, place, and time.  Psychiatric:        Behavior: Behavior normal.     ED Results / Procedures / Treatments   Labs (all labs  ordered are listed, but only abnormal results are displayed) Labs Reviewed  SARS CORONAVIRUS 2 AG (30 MIN TAT)  BASIC METABOLIC PANEL  CBC  D-DIMER, QUANTITATIVE (NOT AT [facility])  CBG MONITORING, ED  TROPONIN I (HIGH SENSITIVITY)    EKG None  Radiology No results found.  Procedures Procedures (including critical care time)  Medications Ordered in ED Medications  aspirin chewable tablet 324 mg (has no administration in time range)    ED Course  I have reviewed the triage vital signs and the nursing notes.  Pertinent labs & imaging results that  were available during my care of the patient were reviewed by me and considered in my medical decision making (see chart for details).  Clinical Course as of Jan 22 1429  Wed Jan 23, 2019  1222 Ecg shows NSR QTC 446, no STEMI, no evidence of ischemia.   Unable to option ecg on computer system to sign   [MT]    Clinical Course User Index [MT] Trifan, Carola Rhine, MD   MDM Rules/Calculators/A&P                      Patient presents with left-sided chest pain.  She did feel like the pain radiated to her left arm.  She notes pain is worse with deep breaths.,  Initial evaluation she denied any left arm pain or shortness of breath.  She has a heart score of 3.  She has had 2 - troponins and no significant change in her troponin.  Per heart pathway, discharge is reasonable.  Her pain has resolved on reevaluation.  Her chest x-ray shows no evidence of pneumonia, pneumothorax, pleural effusion.  She is Wells moderate risk, D-dimer negative. She has had no recent travel/trauma/immobilization, peripheral edema.  History and physical not consistent with PE.  History and physical not consistent with dissection, pericarditis.  Discussed with patient and her son.  They were agreeable with discharge.  Given return precautions.  Patient remained stable for discharge.   At this time there does not appear to be any evidence of an acute emergency medical  condition and the patient appears stable for discharge with appropriate outpatient follow up.Diagnosis was discussed with patient who verbalizes understanding and is agreeable to discharge.       Final Clinical Impression(s) / ED Diagnoses Final diagnoses:  None    Rx / DC Orders ED Discharge Orders    None       Rachel Moulds 01/23/19 1806    Wyvonnia Dusky, MD 01/23/19 [phone]

## 2019-01-23 NOTE — ED Notes (Signed)
ED Provider at bedside. 

## 2019-01-23 NOTE — Discharge Instructions (Signed)
Please follow-up with your primary care provider within a week for continued evaluation.  Return to ED immediately for new or worsening symptoms, such as new or worsening chest pain, shortness of breath, fevers, vomiting or any concerns at all.

## 2019-03-17 ENCOUNTER — Ambulatory Visit: Payer: Self-pay

## 2019-08-21 ENCOUNTER — Ambulatory Visit: Payer: No Typology Code available for payment source | Admitting: Diagnostic Neuroimaging

## 2019-11-19 ENCOUNTER — Emergency Department (HOSPITAL_BASED_OUTPATIENT_CLINIC_OR_DEPARTMENT_OTHER)
Admission: EM | Admit: 2019-11-19 | Discharge: 2019-11-19 | Disposition: A | Discharge status: Home / Self Care | Payer: BLUE CROSS/BLUE SHIELD | Attending: Emergency Medicine | Admitting: Emergency Medicine

## 2019-11-19 ENCOUNTER — Encounter (HOSPITAL_BASED_OUTPATIENT_CLINIC_OR_DEPARTMENT_OTHER): Payer: Self-pay | Admitting: *Deleted

## 2019-11-19 ENCOUNTER — Emergency Department (HOSPITAL_BASED_OUTPATIENT_CLINIC_OR_DEPARTMENT_OTHER): Payer: BLUE CROSS/BLUE SHIELD

## 2019-11-19 ENCOUNTER — Other Ambulatory Visit: Payer: Self-pay

## 2019-11-19 DIAGNOSIS — R0789 Other chest pain: Secondary | ICD-10-CM | POA: Diagnosis not present

## 2019-11-19 DIAGNOSIS — R519 Headache, unspecified: Secondary | ICD-10-CM | POA: Insufficient documentation

## 2019-11-19 DIAGNOSIS — I2510 Atherosclerotic heart disease of native coronary artery without angina pectoris: Secondary | ICD-10-CM | POA: Diagnosis not present

## 2019-11-19 DIAGNOSIS — R509 Fever, unspecified: Secondary | ICD-10-CM | POA: Diagnosis not present

## 2019-11-19 DIAGNOSIS — Z7982 Long term (current) use of aspirin: Secondary | ICD-10-CM | POA: Insufficient documentation

## 2019-11-19 DIAGNOSIS — R197 Diarrhea, unspecified: Secondary | ICD-10-CM

## 2019-11-19 DIAGNOSIS — Z79899 Other long term (current) drug therapy: Secondary | ICD-10-CM | POA: Diagnosis not present

## 2019-11-19 DIAGNOSIS — I119 Hypertensive heart disease without heart failure: Secondary | ICD-10-CM | POA: Diagnosis not present

## 2019-11-19 DIAGNOSIS — Z20822 Contact with and (suspected) exposure to covid-19: Secondary | ICD-10-CM | POA: Insufficient documentation

## 2019-11-19 DIAGNOSIS — M791 Myalgia, unspecified site: Secondary | ICD-10-CM | POA: Diagnosis not present

## 2019-11-19 DIAGNOSIS — R079 Chest pain, unspecified: Secondary | ICD-10-CM

## 2019-11-19 LAB — URINALYSIS, ROUTINE W REFLEX MICROSCOPIC
Bilirubin Urine: NEGATIVE
Glucose, UA: NEGATIVE mg/dL
Ketones, ur: NEGATIVE mg/dL
Leukocytes,Ua: NEGATIVE
Nitrite: NEGATIVE
Protein, ur: NEGATIVE mg/dL
Specific Gravity, Urine: 1.015 (ref 1.005–1.030)
pH: 6 (ref 5.0–8.0)

## 2019-11-19 LAB — COMPREHENSIVE METABOLIC PANEL
ALT: 47 U/L — ABNORMAL HIGH (ref 0–44)
AST: 34 U/L (ref 15–41)
Albumin: 4.2 g/dL (ref 3.5–5.0)
Alkaline Phosphatase: 75 U/L (ref 38–126)
Anion gap: 13 (ref 5–15)
BUN: 14 mg/dL (ref 6–20)
CO2: 25 mmol/L (ref 22–32)
Calcium: 8.8 mg/dL — ABNORMAL LOW (ref 8.9–10.3)
Chloride: 97 mmol/L — ABNORMAL LOW (ref 98–111)
Creatinine, Ser: 0.81 mg/dL (ref 0.44–1.00)
GFR, Estimated: >60 mL/min (ref >60)
Glucose, Bld: 97 mg/dL (ref 70–99)
Potassium: 3.3 mmol/L — ABNORMAL LOW (ref 3.5–5.1)
Sodium: 135 mmol/L (ref 135–145)
Total Bilirubin: 0.6 mg/dL (ref 0.3–1.2)
Total Protein: 7.8 g/dL (ref 6.5–8.1)

## 2019-11-19 LAB — CBC WITH DIFFERENTIAL/PLATELET
Abs Immature Granulocytes: 0.07 10*3/uL (ref 0.00–0.07)
Basophils Absolute: 0.1 10*3/uL (ref 0.0–0.1)
Basophils Relative: 0 %
Eosinophils Absolute: 0 10*3/uL (ref 0.0–0.5)
Eosinophils Relative: 0 %
HCT: 43.1 % (ref 36.0–46.0)
Hemoglobin: 14 g/dL (ref 12.0–15.0)
Immature Granulocytes: 0 %
Lymphocytes Relative: 8 %
Lymphs Abs: 1.3 10*3/uL (ref 0.7–4.0)
MCH: 29.9 pg (ref 26.0–34.0)
MCHC: 32.5 g/dL (ref 30.0–36.0)
MCV: 92.1 fL (ref 80.0–100.0)
Monocytes Absolute: 1 10*3/uL (ref 0.1–1.0)
Monocytes Relative: 7 %
Neutro Abs: 13.4 10*3/uL — ABNORMAL HIGH (ref 1.7–7.7)
Neutrophils Relative %: 85 %
Platelets: 286 10*3/uL (ref 150–400)
RBC: 4.68 MIL/uL (ref 3.87–5.11)
RDW: 14.1 % (ref 11.5–15.5)
WBC: 15.9 10*3/uL — ABNORMAL HIGH (ref 4.0–10.5)
nRBC: 0 % (ref 0.0–0.2)

## 2019-11-19 LAB — URINALYSIS, MICROSCOPIC (REFLEX)

## 2019-11-19 LAB — RESPIRATORY PANEL BY RT PCR (FLU A&B, COVID)
Influenza A by PCR: NEGATIVE
Influenza B by PCR: NEGATIVE
SARS Coronavirus 2 by RT PCR: NEGATIVE

## 2019-11-19 LAB — TROPONIN I (HIGH SENSITIVITY)
Troponin I (High Sensitivity): 8 ng/L (ref <18)
Troponin I (High Sensitivity): 7 ng/L (ref <18)

## 2019-11-19 LAB — LACTIC ACID, PLASMA: Lactic Acid, Venous: 1.2 mmol/L (ref 0.5–1.9)

## 2019-11-19 LAB — LIPASE, BLOOD: Lipase: 28 U/L (ref 11–51)

## 2019-11-19 MED ORDER — METOCLOPRAMIDE HCL 5 MG/ML IJ SOLN
10.0000 mg | Freq: Once | INTRAMUSCULAR | Status: AC
  Administered 2019-11-19: 10 mg via INTRAVENOUS
  Filled 2019-11-19: qty 2

## 2019-11-19 MED ORDER — FENTANYL CITRATE (PF) 100 MCG/2ML IJ SOLN
50.0000 ug | Freq: Once | INTRAMUSCULAR | Status: AC
  Administered 2019-11-19: 50 ug via INTRAVENOUS
  Filled 2019-11-19: qty 2

## 2019-11-19 MED ORDER — ONDANSETRON HCL 4 MG/2ML IJ SOLN
4.0000 mg | Freq: Once | INTRAMUSCULAR | Status: AC
  Administered 2019-11-19: 4 mg via INTRAVENOUS
  Filled 2019-11-19: qty 2

## 2019-11-19 MED ORDER — LOPERAMIDE HCL 2 MG PO CAPS: 2.0000 mg | ORAL_CAPSULE | Freq: Four times a day (QID) | ORAL | 0 refills | Status: AC | PRN

## 2019-11-19 MED ORDER — SODIUM CHLORIDE 0.9 % IV BOLUS
1000.0000 mL | Freq: Once | INTRAVENOUS | Status: AC
  Administered 2019-11-19: 1000 mL via INTRAVENOUS

## 2019-11-19 MED ORDER — ONDANSETRON 4 MG PO TBDP: 4.0000 mg | ORAL_TABLET | ORAL | 0 refills | Status: AC | PRN

## 2019-11-19 NOTE — ED Provider Notes (Signed)
Recent diarrhea, 13 episodes yesterday, nausea, no vomiting and fever. At beach, ate seafood. Sick for 3 days. Physical Exam  BP 112/68   Pulse 96   Temp 99.6 F (37.6 C) (Oral)   Resp (!) 25   Ht 5' (1.524 m)   Wt 81.2 kg   LMP 12/24/2001   SpO2 98%   BMI 34.96 kg/m   Physical Exam Alert and nontoxic.  No respiratory distress.  Well in appearance. ED Course/Procedures   Clinical Course as of Nov 19 1507  Tue Nov 19, 2019  1318 Chest x-ray interpreted by me as no acute infiltrates.   [MB]  1502 Updated patient and her son with results so far.  She said she is feeling somewhat better.  Still has a headache and some chest discomfort.  Will order some more fluids and some Toradol.   [MB]    Clinical Course User Index [MB] Terrilee Files, MD    Procedures  MDM  Rehydrate, f/u labs, anticipate D/C.  Recheck 16: 50 patient feels much improved.  She is alert and nontoxic.  Vital signs are normal.  Have reviewed plan for symptomatic treatment at home with Zofran and Imodium.  I have reviewed return precautions with the patient's son.  Stable for discharge.     Arby Barrette, MD 11/19/19 1654

## 2019-11-19 NOTE — ED Notes (Signed)
O2 sat at 82% with good wave form.  O2 at 2 L started.  O2 sats after oxygen is 97%.  Son at bedside.  Pt. Is alert and oriented.

## 2019-11-19 NOTE — ED Notes (Signed)
EKG done in triage

## 2019-11-19 NOTE — Discharge Instructions (Addendum)
You were seen in the emergency department for evaluation of fever headache body aches diarrhea chest pain.  You had blood work EKG chest x-ray and Covid testing.  Your Covid test was negative.  Your chest x-ray did not show any obvious pneumonia.  You probably have a virus causing diarrhea.  You may take Zofran for nausea as prescribed.  You may take Imodium for diarrhea as prescribed.  Stay hydrated with plenty of fluids.  You may drink sports drinks, dilute tea, water with a small amount of salt and orange juice.  Make an appointment for recheck with your doctor this week.  Return to the emergency department if you develop recurrence of fever, worsening abdominal pain, vomiting and cannot stay hydrated or other concerning symptoms.

## 2019-11-19 NOTE — ED Provider Notes (Signed)
[facility] EMERGENCY DEPARTMENT Provider Note   CSN: [id number] Arrival date & time: 11/19/19  1147     History Chief Complaint  Patient presents with  . Fever    Kiara Sanchez is a 59 y.o. female.  She is Spanish speaker and her son is translating at her request.  She is here with complaint of 3 days of headache body aches chest tightness shortness of breath diarrhea and last evening had a fever to 104.  No runny nose sore throat or cough.  No urinary symptoms.  Saw a walk-in clinic today and tested negative for flu and Covid.  Sent here for further evaluation.  No sick contacts.  Did travel to [facility] when the symptoms began.  The history is provided by the patient and a relative.  Fever Max temp prior to arrival:  104 Temp source:  Oral Onset quality:  Sudden Progression:  Improving Chronicity:  New Worsened by:  Nothing Ineffective treatments:  None tried Associated symptoms: chest pain, diarrhea, headaches, myalgias and nausea   Associated symptoms: no confusion, no cough, no dysuria, no rash, no rhinorrhea, no somnolence, no sore throat and no vomiting   Chest pain:    Quality: tightness     Severity:  Moderate   Onset quality:  Gradual   Timing:  Intermittent   Progression:  Unchanged   Chronicity:  New Diarrhea:    Quality:  Watery   Number of occurrences:  13   Severity:  Severe   Timing:  Intermittent   Progression:  Unchanged Risk factors: recent travel   Risk factors: no immunosuppression and no sick contacts        Past Medical History:  Diagnosis Date  . Arthritis    "all over"  . Coronary artery disease   . Family history of adverse reaction to anesthesia    "sister almost stopped breathing"   . GERD (gastroesophageal reflux disease)   . Hypercholesterolemia   . Hypertension   . Insomnia 10/28/2016  . Kidney stones    remote hx/notes 10/25/2008  . OSA (obstructive sleep apnea) 10/28/2016  . OSA on CPAP   . Pneumonia ~ 2014     Patient Active Problem List   Diagnosis Date Noted  . OSA (obstructive sleep apnea) 10/28/2016  . Insomnia 10/28/2016  . Chest pain 10/27/2014  . Hypokalemia 10/27/2014  . Fever 10/27/2014  . Depression 10/27/2014  . 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 Anxiety   . GERD (gastroesophageal reflux disease)   . Cough   . Essential hypertension 07/31/2013  . Hyperlipidemia 07/31/2013  . Palpitations 07/31/2013  . Paresthesias 06/27/2013    Past Surgical History:  Procedure Laterality Date  . ABDOMINAL HYSTERECTOMY    . APPENDECTOMY    . CARDIAC CATHETERIZATION  03/2009   Cline Crock 04/23/2009  . CORONARY ANGIOPLASTY WITH STENT PLACEMENT  ~ 2006   [employer] in [place]   . REFRACTIVE SURGERY Bilateral 1990's  . SUPRAVENTRICULAR TACHYCARDIA ABLATION     [employer] in [place]      [place] History   No obstetric history on file.     Family History  Problem Relation Age of Onset  . Diabetes Mother   . Heart disease Mother        Pacemaker placement  . Congestive Heart Failure Mother   . Heart murmur Mother   . Heart attack Father     Social History   Tobacco Use  . Smoking status:  Never Smoker  . Smokeless tobacco: Never Used  Substance Use Topics  . Alcohol use: Yes    Comment: 10/27/2014 "glass of wine q couple months"  . Drug use: No    Home Medications Prior to Admission medications   Medication Sig Start Date End Date Taking? Authorizing Provider  aspirin EC 81 MG tablet Take 1 tablet (81 mg total) by mouth daily. 08/04/16   Kathlen Mody, MD  atorvastatin (LIPITOR) 40 MG tablet 1 tab by mouth daily with evening meal 03/09/17   Julieanne Manson, MD  cetirizine (ZYRTEC) 10 MG tablet Take 10 mg by mouth daily as needed for allergies.    [provider]  COLLAGEN PO Take 1 capsule by mouth daily. For muscle pain    [provider]  Ketotifen Fumarate (ALLERGY EYE DROPS OP) Place 1 drop into  both eyes daily as needed (itching/allergies).    [provider]  meclizine (ANTIVERT) 25 MG tablet Take 1 tablet (25 mg total) by mouth 3 (three) times daily as needed for dizziness. 10/10/17   Elpidio Anis, PA-C  metoprolol tartrate (LOPRESSOR) 25 MG tablet TAKE 1 TABLET BY MOUTH TWICE DAILY 10/30/17   Julieanne Manson, MD  pantoprazole (PROTONIX) 20 MG tablet Take 2 tablets (40 mg total) by mouth daily. 08/04/16   Kathlen Mody, MD  triamterene-hydrochlorothiazide (MAXZIDE-25) 37.5-25 MG tablet TAKE 1 TABLET BY MOUTH ONCE DAILY 10/30/17   Julieanne Manson, MD    Allergies    Naproxen  Review of Systems   Review of Systems  Constitutional: Positive for fever.  HENT: Negative for rhinorrhea and sore throat.   Eyes: Negative for visual disturbance.  Respiratory: Negative for cough and shortness of breath.   Cardiovascular: Positive for chest pain.  Gastrointestinal: Positive for diarrhea and nausea. Negative for abdominal pain and vomiting.  Genitourinary: Negative for dysuria.  Musculoskeletal: Positive for myalgias.  Skin: Negative for rash.  Neurological: Positive for headaches.  Psychiatric/Behavioral: Negative for confusion.    Physical Exam Updated Vital Signs BP 107/61 (BP Location: Right Arm)   Pulse 97   Temp 99.6 F (37.6 C) (Oral)   Resp 18   Ht 5' (1.524 m)   Wt 81.2 kg   LMP 12/24/2001   SpO2 96%   BMI 34.96 kg/m   Physical Exam Vitals and nursing note reviewed.  Constitutional:      General: She is not in acute distress.    Appearance: Normal appearance. She is well-developed.  HENT:     Head: Normocephalic and atraumatic.  Eyes:     Conjunctiva/sclera: Conjunctivae normal.  Cardiovascular:     Rate and Rhythm: Normal rate and regular rhythm.     Heart sounds: No murmur heard.   Pulmonary:     Effort: Pulmonary effort is normal. No respiratory distress.     Breath sounds: Normal breath sounds.  Abdominal:     Palpations: Abdomen is  soft.     Tenderness: There is no abdominal tenderness. There is no guarding or rebound.  Musculoskeletal:        General: No deformity or signs of injury. Normal range of motion.     Cervical back: Neck supple.  Skin:    General: Skin is warm and dry.     Capillary Refill: Capillary refill takes less than 2 seconds.  Neurological:     General: No focal deficit present.     Mental Status: She is alert.     ED Results / Procedures / Treatments  Labs (all labs ordered are listed, but only abnormal results are displayed) Labs Reviewed  COMPREHENSIVE METABOLIC PANEL - Abnormal; Notable for the following components:      Result Value   Potassium 3.3 (*)    Chloride 97 (*)    Calcium 8.8 (*)    ALT 47 (*)    All other components within normal limits  CBC WITH DIFFERENTIAL/PLATELET - Abnormal; Notable for the following components:   WBC 15.9 (*)    Neutro Abs 13.4 (*)    All other components within normal limits  URINALYSIS, ROUTINE W REFLEX MICROSCOPIC - Abnormal; Notable for the following components:   Hgb urine dipstick TRACE (*)    All other components within normal limits  URINALYSIS, MICROSCOPIC (REFLEX) - Abnormal; Notable for the following components:   Bacteria, UA FEW (*)    All other components within normal limits  RESPIRATORY PANEL BY RT PCR (FLU A&B, COVID)  CULTURE, BLOOD (ROUTINE X 2)  CULTURE, BLOOD (ROUTINE X 2)  URINE CULTURE  GASTROINTESTINAL PANEL BY PCR, STOOL (REPLACES STOOL CULTURE)  C DIFFICILE QUICK SCREEN W PCR REFLEX  LIPASE, BLOOD  LACTIC ACID, PLASMA  TROPONIN I (HIGH SENSITIVITY)  TROPONIN I (HIGH SENSITIVITY)    EKG EKG Interpretation  Date/Time:  Tuesday November 19 2019 13:46:20 EDT Ventricular Rate:  95 PR Interval:    QRS Duration: 89 QT Interval:  361 QTC Calculation: 454 R Axis:   50 Text Interpretation: Sinus rhythm Borderline T abnormalities, anterior leads No significant change since prior 12/20 Confirmed by Meridee ScoreButler, Amaan Meyer  [phone]) on 11/19/2019 1:47:52 PM   Radiology DG Chest Port 1 View  Result Date: 11/19/2019 CLINICAL DATA:  Chest pain EXAM: PORTABLE CHEST 1 VIEW COMPARISON:  01/23/2019 chest radiograph and prior. FINDINGS: Cardiomediastinal silhouette is unchanged. No focal consolidation, pneumothorax or pleural effusion. Multilevel spondylosis. IMPRESSION: No focal airspace disease. Electronically Signed   By: Stana Buntinghikanele  Emekauwa M.D.   On: 11/19/2019 13:14    Procedures Procedures (including critical care time)  Medications Ordered in ED Medications  sodium chloride 0.9 % bolus 1,000 mL (0 mLs Intravenous Stopped 11/19/19 1514)  ondansetron (ZOFRAN) injection 4 mg (4 mg Intravenous Given 11/19/19 1351)  fentaNYL (SUBLIMAZE) injection 50 mcg (50 mcg Intravenous Given 11/19/19 1351)  sodium chloride 0.9 % bolus 1,000 mL (0 mLs Intravenous Stopped 11/19/19 1632)  metoCLOPramide (REGLAN) injection 10 mg (10 mg Intravenous Given 11/19/19 1514)    ED Course  I have reviewed the triage vital signs and the nursing notes.  Pertinent labs & imaging results that were available during my care of the patient were reviewed by me and considered in my medical decision making (see chart for details).  Clinical Course as of Nov 18 1652  Tue Nov 19, 2019  1318 Chest x-ray interpreted by me as no acute infiltrates.   [MB]  1502 Updated patient and her son with results so far.  She said she is feeling somewhat better.  Still has a headache and some chest discomfort.  Will order some more fluids and some Toradol.   [MB]    Clinical Course User Index [MB] Terrilee FilesButler, Byron Peacock C, MD   MDM Rules/Calculators/A&P                         Levonne HubertMaria Mandell was evaluated in Emergency Department on 11/19/2019 for the symptoms described in the history of present illness. She was evaluated in the context of the global COVID-19 pandemic, which necessitated  consideration that the patient might be at risk for infection with the  SARS-CoV-2 virus that causes COVID-19. Institutional protocols and algorithms that pertain to the evaluation of patients at risk for COVID-19 are in a state of rapid change based on information released by regulatory bodies including the CDC and federal and state organizations. These policies and algorithms were followed during the patient's care in the ED.  This patient complains of fever headache body aches nausea diarrhea; this involves an extensive number of treatment Options and is a complaint that carries with it a high risk of complications and Morbidity. The differential includes Covid, viral syndrome, gastroenteritis, food poisoning, metabolic derangement, renal failure, ACS, pneumonia, pyelonephritis  I ordered, reviewed and interpreted labs, which included CBC with elevated white count, normal hemoglobin, chemistries fairly normal other than low sodium and chloride, lactic acid not elevated, Covid and flu testing negative, first troponin unremarkable.  Stool studies C. difficile delta troponin blood cultures all pending I ordered medication IV fluids, pain medication nausea medication with improvement in her symptoms I ordered imaging studies which included chest x-ray and I independently    visualized and interpreted imaging which showed no acute pulmonary disease Additional history obtained from patient son Previous records obtained and reviewed in epic, no recent admissions.  Reviewed urgent care visit earlier today.  After the interventions stated above, I reevaluated the patient and found patient to be feeling somewhat improved.  She is still pending some testing and I think she would benefit from more IV fluids.  Signed out to oncoming provider Dr. Clarice Pole to follow-up on urinalysis and delta troponin.   Final Clinical Impression(s) / ED Diagnoses Final diagnoses:  Fever, unspecified fever cause  Diarrhea, unspecified type  Myalgia  Nonspecific chest pain    Rx / DC  Orders ED Discharge Orders         Ordered    loperamide (IMODIUM) 2 MG capsule  4 times daily PRN        11/19/19 1651    ondansetron (ZOFRAN ODT) 4 MG disintegrating tablet  Every 4 hours PRN        11/19/19 1651           Terrilee Files, MD 11/19/19 1657

## 2019-11-19 NOTE — ED Notes (Signed)
Pt unable to use bathroom at this time.

## 2019-11-19 NOTE — ED Triage Notes (Signed)
Fever(tmax 104), diarrhea, body aches, chest tightness x 2 days. Negative covid test at PCP.  Denies cough

## 2019-11-21 LAB — URINE CULTURE

## 2019-11-24 LAB — CULTURE, BLOOD (ROUTINE X 2)
Culture: NO GROWTH
Culture: NO GROWTH
Special Requests: ADEQUATE
Special Requests: ADEQUATE

## 2020-01-05 ENCOUNTER — Emergency Department (HOSPITAL_BASED_OUTPATIENT_CLINIC_OR_DEPARTMENT_OTHER): Payer: BLUE CROSS/BLUE SHIELD

## 2020-01-05 ENCOUNTER — Emergency Department (HOSPITAL_BASED_OUTPATIENT_CLINIC_OR_DEPARTMENT_OTHER)
Admission: EM | Admit: 2020-01-05 | Discharge: 2020-01-05 | Disposition: A | Discharge status: Home / Self Care | Payer: BLUE CROSS/BLUE SHIELD | Attending: Emergency Medicine | Admitting: Emergency Medicine

## 2020-01-05 ENCOUNTER — Encounter (HOSPITAL_BASED_OUTPATIENT_CLINIC_OR_DEPARTMENT_OTHER): Payer: Self-pay | Admitting: Emergency Medicine

## 2020-01-05 ENCOUNTER — Other Ambulatory Visit: Payer: Self-pay

## 2020-01-05 DIAGNOSIS — I10 Essential (primary) hypertension: Secondary | ICD-10-CM | POA: Insufficient documentation

## 2020-01-05 DIAGNOSIS — W108XXA Fall (on) (from) other stairs and steps, initial encounter: Secondary | ICD-10-CM | POA: Insufficient documentation

## 2020-01-05 DIAGNOSIS — I25810 Atherosclerosis of coronary artery bypass graft(s) without angina pectoris: Secondary | ICD-10-CM | POA: Diagnosis not present

## 2020-01-05 DIAGNOSIS — Y9301 Activity, walking, marching and hiking: Secondary | ICD-10-CM | POA: Diagnosis not present

## 2020-01-05 DIAGNOSIS — Z955 Presence of coronary angioplasty implant and graft: Secondary | ICD-10-CM | POA: Insufficient documentation

## 2020-01-05 DIAGNOSIS — Z7982 Long term (current) use of aspirin: Secondary | ICD-10-CM | POA: Diagnosis not present

## 2020-01-05 DIAGNOSIS — M25562 Pain in left knee: Secondary | ICD-10-CM | POA: Diagnosis present

## 2020-01-05 MED ORDER — IBUPROFEN 600 MG PO TABS: 600.0000 mg | ORAL_TABLET | Freq: Four times a day (QID) | ORAL | 0 refills | Status: AC | PRN

## 2020-01-05 MED ORDER — ACETAMINOPHEN 325 MG PO TABS: 650.0000 mg | ORAL_TABLET | Freq: Four times a day (QID) | ORAL | 0 refills | Status: AC | PRN

## 2020-01-05 MED ORDER — IBUPROFEN 800 MG PO TABS
800.0000 mg | ORAL_TABLET | Freq: Once | ORAL | Status: AC
  Administered 2020-01-05: 800 mg via ORAL
  Filled 2020-01-05: qty 1

## 2020-01-05 MED ORDER — OXYCODONE-ACETAMINOPHEN 5-325 MG PO TABS
1.0000 | ORAL_TABLET | Freq: Once | ORAL | Status: AC
  Administered 2020-01-05: 1 via ORAL
  Filled 2020-01-05: qty 1

## 2020-01-05 NOTE — ED Notes (Signed)
Family member translated instructions for knee immobilizer and crutches, patient has no questions at this time.

## 2020-01-05 NOTE — ED Provider Notes (Signed)
[facility] EMERGENCY DEPARTMENT Provider Note   CSN: [id number] Arrival date & time: 01/05/20  0845     History Chief Complaint  Patient presents with   Knee Injury    Kiara Sanchez is a 59 y.o. female who is Spanish-speaking, and has her son present at bedside helping to translate, presented emergency department with a mechanical fall onto her left knee.  She reports she was walking up the stairs and lost her footing, and fell forward and struck her left kneecap directly into a cement stair.  She has significant pain at the time.  She hass not been able to bear any weight since then.  She denies any other falls or injuries.  She is on a blood thinners.  She denies any history of prior surgery or injuries to that knee.  HPI     Past Medical History:  Diagnosis Date   Arthritis    "all over"   Coronary artery disease    Family history of adverse reaction to anesthesia    "sister almost stopped breathing"    GERD (gastroesophageal reflux disease)    Hypercholesterolemia    Hypertension    Insomnia 10/28/2016   Kidney stones    remote hx/notes 10/25/2008   OSA (obstructive sleep apnea) 10/28/2016   OSA on CPAP    Pneumonia ~ 2014    Patient Active Problem List   Diagnosis Date Noted   OSA (obstructive sleep apnea) 10/28/2016   Insomnia 10/28/2016   Chest pain 10/27/2014   Hypokalemia 10/27/2014   Fever 10/27/2014   Depression 10/27/2014   CAD (coronary artery disease) of artery bypass graft 10/27/2014   Anxiety    GERD (gastroesophageal reflux disease)    Cough    Essential hypertension 07/31/2013   Hyperlipidemia 07/31/2013   Palpitations 07/31/2013   Paresthesias 06/27/2013    Past Surgical History:  Procedure Laterality Date   ABDOMINAL HYSTERECTOMY     APPENDECTOMY     CARDIAC CATHETERIZATION  03/2009   Cline Crock 04/23/2009   CORONARY ANGIOPLASTY WITH STENT PLACEMENT  ~ 2006   [employer] in [place]     REFRACTIVE SURGERY Bilateral 1990's   SUPRAVENTRICULAR TACHYCARDIA ABLATION     [employer] in [place]      [place] History   No obstetric history on file.     Family History  Problem Relation Age of Onset   Diabetes Mother    Heart disease Mother        Pacemaker placement   Congestive Heart Failure Mother    Heart murmur Mother    Heart attack Father     Social History   Tobacco Use   Smoking status: Never Smoker   Smokeless tobacco: Never Used  Substance Use Topics   Alcohol use: Yes    Comment: 10/27/2014 "glass of wine q couple months"   Drug use: No    Home Medications Prior to Admission medications   Medication Sig Start Date End Date Taking? Authorizing Provider  acetaminophen (TYLENOL) 325 MG tablet Take 2 tablets (650 mg total) by mouth every 6 (six) hours as needed for up to 30 doses for mild pain. 01/05/20   Terald Sleeper, MD  aspirin EC 81 MG tablet Take 1 tablet (81 mg total) by mouth daily. 08/04/16   Kathlen Mody, MD  atorvastatin (LIPITOR) 40 MG tablet 1 tab by mouth daily with evening meal 03/09/17   Julieanne Manson, MD  cetirizine (ZYRTEC) 10 MG tablet Take 10 mg by mouth daily  as needed for allergies.    [provider]  COLLAGEN PO Take 1 capsule by mouth daily. For muscle pain    [provider]  ibuprofen (ADVIL) 600 MG tablet Take 1 tablet (600 mg total) by mouth every 6 (six) hours as needed for up to 30 doses for mild pain or moderate pain. 01/05/20   Terald Sleeper, MD  Ketotifen Fumarate (ALLERGY EYE DROPS OP) Place 1 drop into both eyes daily as needed (itching/allergies).    [provider]  loperamide (IMODIUM) 2 MG capsule Take 1 capsule (2 mg total) by mouth 4 (four) times daily as needed for diarrhea or loose stools. 11/19/19   Arby Barrette, MD  meclizine (ANTIVERT) 25 MG tablet Take 1 tablet (25 mg total) by mouth 3 (three) times daily as needed for dizziness. 10/10/17   Elpidio Anis,  PA-C  metoprolol tartrate (LOPRESSOR) 25 MG tablet TAKE 1 TABLET BY MOUTH TWICE DAILY 10/30/17   Julieanne Manson, MD  ondansetron (ZOFRAN ODT) 4 MG disintegrating tablet Take 1 tablet (4 mg total) by mouth every 4 (four) hours as needed for nausea or vomiting. 11/19/19   Arby Barrette, MD  pantoprazole (PROTONIX) 20 MG tablet Take 2 tablets (40 mg total) by mouth daily. 08/04/16   Kathlen Mody, MD  triamterene-hydrochlorothiazide (MAXZIDE-25) 37.5-25 MG tablet TAKE 1 TABLET BY MOUTH ONCE DAILY 10/30/17   Julieanne Manson, MD    Allergies    Naproxen  Review of Systems   Review of Systems  Constitutional: Negative for chills and fever.  Respiratory: Negative for cough and shortness of breath.   Cardiovascular: Negative for chest pain and palpitations.  Gastrointestinal: Negative for abdominal pain and vomiting.  Genitourinary: Negative for hematuria.  Musculoskeletal: Positive for arthralgias and myalgias.  Skin: Negative for rash and wound.  Neurological: Negative for weakness and numbness.  Psychiatric/Behavioral: Negative for agitation and confusion.  All other systems reviewed and are negative.   Physical Exam Updated Vital Signs BP (!) 141/64    Pulse 89    Temp 98.7 F (37.1 C) (Oral)    Resp 16    Ht 5\' 7"  (1.702 m)    Wt 80.7 kg    LMP 12/24/2001    SpO2 94%    BMI 27.88 kg/m   Physical Exam Vitals and nursing note reviewed.  Constitutional:      General: She is not in acute distress.    Appearance: She is well-developed.  HENT:     Head: Normocephalic and atraumatic.  Eyes:     Conjunctiva/sclera: Conjunctivae normal.  Cardiovascular:     Rate and Rhythm: Normal rate and regular rhythm.     Pulses: Normal pulses.  Pulmonary:     Effort: Pulmonary effort is normal. No respiratory distress.  Abdominal:     Palpations: Abdomen is soft.     Tenderness: There is no abdominal tenderness.  Musculoskeletal:     Cervical back: Neck supple.     Comments: Abrasion  to patella Isolated ttp of the patella Patient able to range knee passively to 45 degrees, able to passively extend lower extremity   Skin:    General: Skin is warm and dry.  Neurological:     General: No focal deficit present.     Mental Status: She is alert and oriented to person, place, and time.     Sensory: No sensory deficit.     Motor: No weakness.  Psychiatric:        Mood and Affect:  Mood normal.        Behavior: Behavior normal.     ED Results / Procedures / Treatments   Labs (all labs ordered are listed, but only abnormal results are displayed) Labs Reviewed - No data to display  EKG None  Radiology CT Knee Left Wo Contrast  Result Date: 01/05/2020 CLINICAL DATA:  Tripped going up the stairs. Abrasions along the patella. Unable to straighten the leg. EXAM: CT OF THE LEFT KNEE WITHOUT CONTRAST TECHNIQUE: Multidetector CT imaging of the LEFT knee was performed according to the standard protocol. Multiplanar CT image reconstructions were also generated. COMPARISON:  None. FINDINGS: Bones/Joint/Cartilage No fracture or dislocation. Normal alignment. Generalized osteopenia. Moderate joint effusion. Small Baker's cyst. No aggressive osseous lesion. Tiny tricompartmental marginal osteophytes likely reflecting early osteoarthritis. Ligaments Ligaments are suboptimally evaluated by CT. Muscles and Tendons Muscles are normal. No muscle atrophy. No intramuscular fluid collection or hematoma. Quadriceps tendon and patellar tendon are intact. Soft tissue No fluid collection or hematoma.  No soft tissue mass. IMPRESSION: 1. No acute osseous injury of the left knee. 2. Moderate joint effusion. 3. Small Baker's cyst. Electronically Signed   By: Elige Ko   On: 01/05/2020 10:20   DG Knee Complete 4 Views Left  Result Date: 01/05/2020 CLINICAL DATA:  Recent fall with knee pain, initial encounter EXAM: LEFT KNEE - COMPLETE 4+ VIEW COMPARISON:  None. FINDINGS: Mild degenerative changes are  noted. No acute fracture or dislocation is noted. No soft tissue abnormality is seen. IMPRESSION: Mild degenerative changes. Electronically Signed   By: Alcide Clever M.D.   On: 01/05/2020 09:44    Procedures Procedures (including critical care time)  Medications Ordered in ED Medications  oxyCODONE-acetaminophen (PERCOCET/ROXICET) 5-325 MG per tablet 1 tablet (1 tablet Oral Given 01/05/20 0942)  ibuprofen (ADVIL) tablet 800 mg (800 mg Oral Given 01/05/20 3299)    ED Course  I have reviewed the triage vital signs and the nursing notes.  Pertinent labs & imaging results that were available during my care of the patient were reviewed by me and considered in my medical decision making (see chart for details).  58 yo female here with left knee pain after mechanical fall onto it yesterday  Her son at bedside provides supplemental history  She is neurovascularly intact.  Doubt arterial injury or knee dislocation from this mechanism.  Her quadriceps and patellar ligaments appear to be in tact on exam  Xrays reviewed -no acute fx noted.  However with her significant patellar ttp on exam, I felt a CT scan was prudent to evaluate for fx.  No acute fx noted on this scan.  She still cannot bear any weight on this leg.  We'll apply an knee immobilizer and give crutches, motrin + tylenol at home, advised they call orthopedics for f/u if her symptoms haven't improved in 7 days.  Pt and son verbalized understanding.  Final Clinical Impression(s) / ED Diagnoses Final diagnoses:  Acute pain of left knee    Rx / DC Orders ED Discharge Orders         Ordered    ibuprofen (ADVIL) 600 MG tablet  Every 6 hours PRN        01/05/20 1036    acetaminophen (TYLENOL) 325 MG tablet  Every 6 hours PRN        01/05/20 1036           Terald Sleeper, MD 01/06/20 1040

## 2020-01-05 NOTE — ED Triage Notes (Signed)
Pt speaks Spanish. Her son is at bedside. She tripped on the stairs injuring her L knee.

## 2020-01-05 NOTE — Discharge Instructions (Signed)
Wear the knee brace only when walking.  You should take it off in bed at night and when resting.  Try to keep your leg elevated as much as possible on the couch at home.  If your pain is not getting any better in 7 days, you can follow up with an orthopedic doctor in 1-2 weeks.

## 2020-07-22 ENCOUNTER — Encounter (HOSPITAL_BASED_OUTPATIENT_CLINIC_OR_DEPARTMENT_OTHER): Payer: Self-pay | Admitting: Emergency Medicine
